# Patient Record
Sex: Female | Born: 1935
Health system: Southern US, Community
[De-identification: ages and names within clinical notes are randomized; demographics above are authoritative.]

## PROBLEM LIST (undated history)

## (undated) DIAGNOSIS — I7300 Raynaud's syndrome without gangrene: Secondary | ICD-10-CM

## (undated) DIAGNOSIS — E079 Disorder of thyroid, unspecified: Secondary | ICD-10-CM

## (undated) DIAGNOSIS — F0390 Unspecified dementia without behavioral disturbance: Secondary | ICD-10-CM

## (undated) DIAGNOSIS — E785 Hyperlipidemia, unspecified: Secondary | ICD-10-CM

## (undated) DIAGNOSIS — I10 Essential (primary) hypertension: Secondary | ICD-10-CM

## (undated) HISTORY — DX: Disorder of thyroid, unspecified: E07.9

## (undated) HISTORY — PX: COLONOSCOPY: SHX174

## (undated) HISTORY — DX: Hyperlipidemia, unspecified: E78.5

## (undated) HISTORY — DX: Essential (primary) hypertension: I10

## (undated) HISTORY — PX: APPENDECTOMY: SHX54

---

## 1997-07-12 ENCOUNTER — Other Ambulatory Visit: Admission: RE | Admit: 1997-07-12 | Discharge: 1997-07-12 | Payer: Self-pay | Admitting: Gynecology

## 1998-10-29 ENCOUNTER — Other Ambulatory Visit: Admission: RE | Admit: 1998-10-29 | Discharge: 1998-10-29 | Payer: Self-pay | Admitting: Gynecology

## 1999-03-30 ENCOUNTER — Encounter: Admission: RE | Admit: 1999-03-30 | Discharge: 1999-03-30 | Payer: Self-pay | Admitting: *Deleted

## 1999-03-30 ENCOUNTER — Encounter: Payer: Self-pay | Admitting: *Deleted

## 1999-08-21 ENCOUNTER — Encounter: Payer: Self-pay | Admitting: Gynecology

## 1999-08-21 ENCOUNTER — Encounter: Admission: RE | Admit: 1999-08-21 | Discharge: 1999-08-21 | Payer: Self-pay | Admitting: Gynecology

## 1999-12-23 ENCOUNTER — Other Ambulatory Visit: Admission: RE | Admit: 1999-12-23 | Discharge: 1999-12-23 | Payer: Self-pay | Admitting: Gynecology

## 2000-11-03 ENCOUNTER — Encounter: Payer: Self-pay | Admitting: Gynecology

## 2000-11-03 ENCOUNTER — Encounter: Admission: RE | Admit: 2000-11-03 | Discharge: 2000-11-03 | Payer: Self-pay | Admitting: Internal Medicine

## 2002-01-03 ENCOUNTER — Encounter: Payer: Self-pay | Admitting: *Deleted

## 2002-01-03 ENCOUNTER — Encounter: Admission: RE | Admit: 2002-01-03 | Discharge: 2002-01-03 | Payer: Self-pay | Admitting: *Deleted

## 2002-04-20 ENCOUNTER — Encounter: Admission: RE | Admit: 2002-04-20 | Discharge: 2002-04-20 | Payer: Self-pay | Admitting: Sports Medicine

## 2002-05-18 ENCOUNTER — Encounter: Admission: RE | Admit: 2002-05-18 | Discharge: 2002-05-18 | Payer: Self-pay | Admitting: Sports Medicine

## 2003-02-12 ENCOUNTER — Encounter: Admission: RE | Admit: 2003-02-12 | Discharge: 2003-02-12 | Payer: Self-pay | Admitting: *Deleted

## 2004-03-11 ENCOUNTER — Ambulatory Visit (HOSPITAL_COMMUNITY): Admission: RE | Admit: 2004-03-11 | Discharge: 2004-03-11 | Payer: Self-pay | Admitting: *Deleted

## 2004-05-26 ENCOUNTER — Ambulatory Visit: Payer: Self-pay | Admitting: Sports Medicine

## 2005-03-18 ENCOUNTER — Other Ambulatory Visit: Admission: RE | Admit: 2005-03-18 | Discharge: 2005-03-18 | Payer: Self-pay | Admitting: *Deleted

## 2005-05-11 ENCOUNTER — Ambulatory Visit (HOSPITAL_COMMUNITY): Admission: RE | Admit: 2005-05-11 | Discharge: 2005-05-11 | Payer: Self-pay | Admitting: *Deleted

## 2006-06-09 ENCOUNTER — Ambulatory Visit (HOSPITAL_COMMUNITY): Admission: RE | Admit: 2006-06-09 | Discharge: 2006-06-09 | Payer: Self-pay | Admitting: *Deleted

## 2006-07-20 ENCOUNTER — Telehealth: Payer: Self-pay | Admitting: *Deleted

## 2006-07-21 ENCOUNTER — Ambulatory Visit: Payer: Self-pay | Admitting: Sports Medicine

## 2006-07-21 DIAGNOSIS — M461 Sacroiliitis, not elsewhere classified: Secondary | ICD-10-CM | POA: Insufficient documentation

## 2006-07-26 ENCOUNTER — Encounter: Payer: Self-pay | Admitting: Family Medicine

## 2006-07-28 ENCOUNTER — Telehealth (INDEPENDENT_AMBULATORY_CARE_PROVIDER_SITE_OTHER): Payer: Self-pay | Admitting: Family Medicine

## 2006-08-17 ENCOUNTER — Telehealth: Payer: Self-pay | Admitting: *Deleted

## 2007-08-16 ENCOUNTER — Ambulatory Visit (HOSPITAL_COMMUNITY): Admission: RE | Admit: 2007-08-16 | Discharge: 2007-08-16 | Payer: Self-pay | Admitting: Family Medicine

## 2008-07-09 ENCOUNTER — Encounter (INDEPENDENT_AMBULATORY_CARE_PROVIDER_SITE_OTHER): Payer: Self-pay | Admitting: *Deleted

## 2008-11-19 ENCOUNTER — Other Ambulatory Visit: Admission: RE | Admit: 2008-11-19 | Discharge: 2008-11-19 | Payer: Self-pay | Admitting: Family Medicine

## 2009-05-16 ENCOUNTER — Telehealth: Payer: Self-pay | Admitting: Gastroenterology

## 2009-09-24 ENCOUNTER — Ambulatory Visit (HOSPITAL_COMMUNITY): Admission: RE | Admit: 2009-09-24 | Discharge: 2009-09-24 | Payer: Self-pay | Admitting: Obstetrics and Gynecology

## 2010-07-06 ENCOUNTER — Ambulatory Visit (AMBULATORY_SURGERY_CENTER): Payer: Medicare Other | Admitting: *Deleted

## 2010-07-06 VITALS — Ht 63.0 in | Wt 135.2 lb

## 2010-07-06 DIAGNOSIS — Z1211 Encounter for screening for malignant neoplasm of colon: Secondary | ICD-10-CM

## 2010-07-06 MED ORDER — PEG-KCL-NACL-NASULF-NA ASC-C 100 G PO SOLR: ORAL | Status: DC

## 2010-07-20 ENCOUNTER — Encounter: Payer: Self-pay | Admitting: Gastroenterology

## 2010-07-20 ENCOUNTER — Ambulatory Visit (AMBULATORY_SURGERY_CENTER): Payer: Medicare Other | Admitting: Gastroenterology

## 2010-07-20 VITALS — BP 141/73 | HR 56 | Temp 98.0°F | Resp 16 | Ht 63.0 in | Wt 130.0 lb

## 2010-07-20 DIAGNOSIS — K648 Other hemorrhoids: Secondary | ICD-10-CM

## 2010-07-20 DIAGNOSIS — Z1211 Encounter for screening for malignant neoplasm of colon: Secondary | ICD-10-CM

## 2010-07-20 MED ORDER — SODIUM CHLORIDE 0.9 % IV SOLN: 500.0000 mL | INTRAVENOUS | Status: DC

## 2010-07-20 NOTE — Patient Instructions (Signed)
INTERNAL HEMORRHOIDS HANDOUT GIVEN  REPEAT COLONOSCOPY IN 10 YEARS--WE WILL MAIL YOU A LETTER TO REMIND YOU OF THIS

## 2010-07-21 ENCOUNTER — Telehealth: Payer: Self-pay | Admitting: *Deleted

## 2010-07-21 NOTE — Telephone Encounter (Signed)

## 2010-08-24 NOTE — Telephone Encounter (Signed)
Summary: Schedule Colonoscopy   Phone Note Outgoing Call Call back at Home Phone [phone]   Call placed by: Harlow Mares CMA Duncan Dull),  May 16, 2009 9:44 AM Call placed to: Patient Summary of Call: patients husband will advise her she is due and have her call back. she is due for her colonoscopy Initial call taken by: Harlow Mares CMA Duncan Dull),  May 16, 2009 9:45 AM

## 2011-03-11 DIAGNOSIS — T63461A Toxic effect of venom of wasps, accidental (unintentional), initial encounter: Secondary | ICD-10-CM | POA: Diagnosis not present

## 2011-03-11 DIAGNOSIS — T6391XA Toxic effect of contact with unspecified venomous animal, accidental (unintentional), initial encounter: Secondary | ICD-10-CM | POA: Diagnosis not present

## 2011-04-07 DIAGNOSIS — T6391XA Toxic effect of contact with unspecified venomous animal, accidental (unintentional), initial encounter: Secondary | ICD-10-CM | POA: Diagnosis not present

## 2011-04-07 DIAGNOSIS — T63461A Toxic effect of venom of wasps, accidental (unintentional), initial encounter: Secondary | ICD-10-CM | POA: Diagnosis not present

## 2011-05-12 DIAGNOSIS — T63461A Toxic effect of venom of wasps, accidental (unintentional), initial encounter: Secondary | ICD-10-CM | POA: Diagnosis not present

## 2011-05-12 DIAGNOSIS — T6391XA Toxic effect of contact with unspecified venomous animal, accidental (unintentional), initial encounter: Secondary | ICD-10-CM | POA: Diagnosis not present

## 2011-06-10 DIAGNOSIS — T63461A Toxic effect of venom of wasps, accidental (unintentional), initial encounter: Secondary | ICD-10-CM | POA: Diagnosis not present

## 2011-06-10 DIAGNOSIS — T6391XA Toxic effect of contact with unspecified venomous animal, accidental (unintentional), initial encounter: Secondary | ICD-10-CM | POA: Diagnosis not present

## 2011-06-29 DIAGNOSIS — J301 Allergic rhinitis due to pollen: Secondary | ICD-10-CM | POA: Diagnosis not present

## 2011-06-29 DIAGNOSIS — J3081 Allergic rhinitis due to animal (cat) (dog) hair and dander: Secondary | ICD-10-CM | POA: Diagnosis not present

## 2011-06-29 DIAGNOSIS — T6391XA Toxic effect of contact with unspecified venomous animal, accidental (unintentional), initial encounter: Secondary | ICD-10-CM | POA: Diagnosis not present

## 2011-06-29 DIAGNOSIS — J3089 Other allergic rhinitis: Secondary | ICD-10-CM | POA: Diagnosis not present

## 2011-07-08 DIAGNOSIS — T6391XA Toxic effect of contact with unspecified venomous animal, accidental (unintentional), initial encounter: Secondary | ICD-10-CM | POA: Diagnosis not present

## 2011-07-08 DIAGNOSIS — T783XXA Angioneurotic edema, initial encounter: Secondary | ICD-10-CM | POA: Diagnosis not present

## 2011-07-08 DIAGNOSIS — E039 Hypothyroidism, unspecified: Secondary | ICD-10-CM | POA: Diagnosis not present

## 2011-07-21 DIAGNOSIS — L821 Other seborrheic keratosis: Secondary | ICD-10-CM | POA: Diagnosis not present

## 2011-07-21 DIAGNOSIS — D239 Other benign neoplasm of skin, unspecified: Secondary | ICD-10-CM | POA: Diagnosis not present

## 2011-07-22 DIAGNOSIS — T63461A Toxic effect of venom of wasps, accidental (unintentional), initial encounter: Secondary | ICD-10-CM | POA: Diagnosis not present

## 2011-07-22 DIAGNOSIS — T6391XA Toxic effect of contact with unspecified venomous animal, accidental (unintentional), initial encounter: Secondary | ICD-10-CM | POA: Diagnosis not present

## 2011-08-23 DIAGNOSIS — T6391XA Toxic effect of contact with unspecified venomous animal, accidental (unintentional), initial encounter: Secondary | ICD-10-CM | POA: Diagnosis not present

## 2011-08-23 DIAGNOSIS — T63461A Toxic effect of venom of wasps, accidental (unintentional), initial encounter: Secondary | ICD-10-CM | POA: Diagnosis not present

## 2011-09-28 DIAGNOSIS — J301 Allergic rhinitis due to pollen: Secondary | ICD-10-CM | POA: Diagnosis not present

## 2011-09-28 DIAGNOSIS — J3089 Other allergic rhinitis: Secondary | ICD-10-CM | POA: Diagnosis not present

## 2011-09-28 DIAGNOSIS — T6391XA Toxic effect of contact with unspecified venomous animal, accidental (unintentional), initial encounter: Secondary | ICD-10-CM | POA: Diagnosis not present

## 2011-09-28 DIAGNOSIS — T63461A Toxic effect of venom of wasps, accidental (unintentional), initial encounter: Secondary | ICD-10-CM | POA: Diagnosis not present

## 2011-09-28 DIAGNOSIS — J3081 Allergic rhinitis due to animal (cat) (dog) hair and dander: Secondary | ICD-10-CM | POA: Diagnosis not present

## 2011-10-06 DIAGNOSIS — T783XXA Angioneurotic edema, initial encounter: Secondary | ICD-10-CM | POA: Diagnosis not present

## 2011-10-18 DIAGNOSIS — I10 Essential (primary) hypertension: Secondary | ICD-10-CM | POA: Diagnosis not present

## 2011-10-18 DIAGNOSIS — E7800 Pure hypercholesterolemia, unspecified: Secondary | ICD-10-CM | POA: Diagnosis not present

## 2011-10-18 DIAGNOSIS — E039 Hypothyroidism, unspecified: Secondary | ICD-10-CM | POA: Diagnosis not present

## 2011-10-22 DIAGNOSIS — I10 Essential (primary) hypertension: Secondary | ICD-10-CM | POA: Diagnosis not present

## 2011-10-22 DIAGNOSIS — M6740 Ganglion, unspecified site: Secondary | ICD-10-CM | POA: Diagnosis not present

## 2011-10-22 DIAGNOSIS — E039 Hypothyroidism, unspecified: Secondary | ICD-10-CM | POA: Diagnosis not present

## 2011-10-22 DIAGNOSIS — E7800 Pure hypercholesterolemia, unspecified: Secondary | ICD-10-CM | POA: Diagnosis not present

## 2011-11-16 DIAGNOSIS — T6391XA Toxic effect of contact with unspecified venomous animal, accidental (unintentional), initial encounter: Secondary | ICD-10-CM | POA: Diagnosis not present

## 2011-11-16 DIAGNOSIS — M19049 Primary osteoarthritis, unspecified hand: Secondary | ICD-10-CM | POA: Diagnosis not present

## 2011-11-16 DIAGNOSIS — T63461A Toxic effect of venom of wasps, accidental (unintentional), initial encounter: Secondary | ICD-10-CM | POA: Diagnosis not present

## 2011-12-02 DIAGNOSIS — H2510 Age-related nuclear cataract, unspecified eye: Secondary | ICD-10-CM | POA: Diagnosis not present

## 2011-12-02 DIAGNOSIS — H5000 Unspecified esotropia: Secondary | ICD-10-CM | POA: Diagnosis not present

## 2011-12-06 DIAGNOSIS — Z23 Encounter for immunization: Secondary | ICD-10-CM | POA: Diagnosis not present

## 2011-12-28 DIAGNOSIS — J3081 Allergic rhinitis due to animal (cat) (dog) hair and dander: Secondary | ICD-10-CM | POA: Diagnosis not present

## 2011-12-28 DIAGNOSIS — T63461A Toxic effect of venom of wasps, accidental (unintentional), initial encounter: Secondary | ICD-10-CM | POA: Diagnosis not present

## 2011-12-28 DIAGNOSIS — T6391XA Toxic effect of contact with unspecified venomous animal, accidental (unintentional), initial encounter: Secondary | ICD-10-CM | POA: Diagnosis not present

## 2011-12-28 DIAGNOSIS — J301 Allergic rhinitis due to pollen: Secondary | ICD-10-CM | POA: Diagnosis not present

## 2011-12-28 DIAGNOSIS — J3089 Other allergic rhinitis: Secondary | ICD-10-CM | POA: Diagnosis not present

## 2012-02-14 DIAGNOSIS — T6391XA Toxic effect of contact with unspecified venomous animal, accidental (unintentional), initial encounter: Secondary | ICD-10-CM | POA: Diagnosis not present

## 2012-02-14 DIAGNOSIS — T63461A Toxic effect of venom of wasps, accidental (unintentional), initial encounter: Secondary | ICD-10-CM | POA: Diagnosis not present

## 2012-03-17 ENCOUNTER — Ambulatory Visit (INDEPENDENT_AMBULATORY_CARE_PROVIDER_SITE_OTHER): Payer: Medicare Other | Admitting: Family Medicine

## 2012-03-17 VITALS — BP 150/90

## 2012-03-17 DIAGNOSIS — M549 Dorsalgia, unspecified: Secondary | ICD-10-CM

## 2012-03-17 NOTE — Patient Instructions (Addendum)
You have some musculoskeletal pain in your low back; part of this can be improved with a good core strengthening program for the kow back. I gave you those exsecises and would recommend doing them daily. I think ibuprofen is Ok as long as you are using the prescribed dose and having no side effects.  If this does not improve your symptoms in 4-6 weeks, please su [place] back in clinic. Great to meet you!

## 2012-03-17 NOTE — Progress Notes (Signed)
  Subjective:    Patient ID: Michelle Reeves, female    DOB: 1934/06/30, 78 y.o.   MRN: [id number]  HPI #1. Low back pain. Reports that is 2-3/10 most of the time. She thinks it started when she and her husband began to travel around the country in their car. He frequently traveled to [place] and to [place] to see family. After sitting in the car for a hours or so she has a lot of stiffness. She has no radiation to the legs specifically no pain or weakness in her legs. Pain is located in the mid lower back a little bit more on the left than the right. She denies any bowel or bladder incontinence. It is using resolved with using a pillow in the car seat and some ibuprofen. She is active using the treadmill and or the elliptical at home for as much as an hour a day. Has been using ibuprofen one over-the-counter tablet every 4-6 hours intermittently for pain relief and that seems to work well.   Review of Systems Denies numbness or weakness in her legs or feet. Pain does not radiate into the buttock or leg area. See history of present illness above for additional partner review of systems.    Objective:   Physical Exam  Vital signs are reviewed. Initially she had elevated blood pressure and this was rechecked and it was still elevated. I urged her to followup with her PCP regarding this. GENERAL: Well-developed female no acute distress BACK: No defect noted. No curvature. Nontender to percussion of the vertebra in the lumbar area. SI joints are nontender to palpation. Faber bilaterally is negative. Extremity: Straight leg raising seated and supine positions is negative. Lower extremity strength is 5 out of 5. Neuro: DTRs are 2+ at the ankle and knee bilaterally.. sensation bilateral lower extremities to soft touch is normal Strength is 5 out of 5 at the hip flexors and extensors lower leg extension and flexion, dorsiflexion and plantar flexion. HIPS: She has full range of motion both hips. She can  hyperextend about 10 in flex at the hips to about 60. Her flexion at the hips is limited somewhat by her extreme tightness of her hamstrings.      Assessment & Plan:  Muscle skeletal or mechanical low back pain, mild. I would put her on the home exercise program for back strengthening and have her followup if this does not resolve. There is no sign or symptom here to make me concerned for spinal cord or disc pathology.

## 2012-03-24 DIAGNOSIS — M272 Inflammatory conditions of jaws: Secondary | ICD-10-CM | POA: Diagnosis not present

## 2012-03-28 DIAGNOSIS — J3081 Allergic rhinitis due to animal (cat) (dog) hair and dander: Secondary | ICD-10-CM | POA: Diagnosis not present

## 2012-03-28 DIAGNOSIS — T6391XA Toxic effect of contact with unspecified venomous animal, accidental (unintentional), initial encounter: Secondary | ICD-10-CM | POA: Diagnosis not present

## 2012-03-28 DIAGNOSIS — T63461A Toxic effect of venom of wasps, accidental (unintentional), initial encounter: Secondary | ICD-10-CM | POA: Diagnosis not present

## 2012-03-28 DIAGNOSIS — J301 Allergic rhinitis due to pollen: Secondary | ICD-10-CM | POA: Diagnosis not present

## 2012-03-28 DIAGNOSIS — J3089 Other allergic rhinitis: Secondary | ICD-10-CM | POA: Diagnosis not present

## 2012-04-15 ENCOUNTER — Other Ambulatory Visit: Payer: Self-pay

## 2012-04-26 DIAGNOSIS — T6391XA Toxic effect of contact with unspecified venomous animal, accidental (unintentional), initial encounter: Secondary | ICD-10-CM | POA: Diagnosis not present

## 2012-04-26 DIAGNOSIS — T63461A Toxic effect of venom of wasps, accidental (unintentional), initial encounter: Secondary | ICD-10-CM | POA: Diagnosis not present

## 2012-05-01 DIAGNOSIS — E7800 Pure hypercholesterolemia, unspecified: Secondary | ICD-10-CM | POA: Diagnosis not present

## 2012-05-01 DIAGNOSIS — E039 Hypothyroidism, unspecified: Secondary | ICD-10-CM | POA: Diagnosis not present

## 2012-05-01 DIAGNOSIS — M6740 Ganglion, unspecified site: Secondary | ICD-10-CM | POA: Diagnosis not present

## 2012-05-01 DIAGNOSIS — I10 Essential (primary) hypertension: Secondary | ICD-10-CM | POA: Diagnosis not present

## 2012-05-22 DIAGNOSIS — J309 Allergic rhinitis, unspecified: Secondary | ICD-10-CM | POA: Diagnosis not present

## 2012-06-30 DIAGNOSIS — J309 Allergic rhinitis, unspecified: Secondary | ICD-10-CM | POA: Diagnosis not present

## 2012-07-28 DIAGNOSIS — T63461A Toxic effect of venom of wasps, accidental (unintentional), initial encounter: Secondary | ICD-10-CM | POA: Diagnosis not present

## 2012-07-28 DIAGNOSIS — T6391XA Toxic effect of contact with unspecified venomous animal, accidental (unintentional), initial encounter: Secondary | ICD-10-CM | POA: Diagnosis not present

## 2012-09-04 DIAGNOSIS — T6391XA Toxic effect of contact with unspecified venomous animal, accidental (unintentional), initial encounter: Secondary | ICD-10-CM | POA: Diagnosis not present

## 2012-09-04 DIAGNOSIS — T63461A Toxic effect of venom of wasps, accidental (unintentional), initial encounter: Secondary | ICD-10-CM | POA: Diagnosis not present

## 2012-10-04 ENCOUNTER — Other Ambulatory Visit: Payer: Self-pay

## 2012-10-19 DIAGNOSIS — E7800 Pure hypercholesterolemia, unspecified: Secondary | ICD-10-CM | POA: Diagnosis not present

## 2012-10-19 DIAGNOSIS — Z79899 Other long term (current) drug therapy: Secondary | ICD-10-CM | POA: Diagnosis not present

## 2012-10-19 DIAGNOSIS — E039 Hypothyroidism, unspecified: Secondary | ICD-10-CM | POA: Diagnosis not present

## 2012-11-01 DIAGNOSIS — E7800 Pure hypercholesterolemia, unspecified: Secondary | ICD-10-CM | POA: Diagnosis not present

## 2012-11-01 DIAGNOSIS — E039 Hypothyroidism, unspecified: Secondary | ICD-10-CM | POA: Diagnosis not present

## 2012-11-01 DIAGNOSIS — Z23 Encounter for immunization: Secondary | ICD-10-CM | POA: Diagnosis not present

## 2012-11-01 DIAGNOSIS — I10 Essential (primary) hypertension: Secondary | ICD-10-CM | POA: Diagnosis not present

## 2012-12-06 DIAGNOSIS — T6391XA Toxic effect of contact with unspecified venomous animal, accidental (unintentional), initial encounter: Secondary | ICD-10-CM | POA: Diagnosis not present

## 2012-12-06 DIAGNOSIS — T63461A Toxic effect of venom of wasps, accidental (unintentional), initial encounter: Secondary | ICD-10-CM | POA: Diagnosis not present

## 2012-12-13 DIAGNOSIS — T6391XA Toxic effect of contact with unspecified venomous animal, accidental (unintentional), initial encounter: Secondary | ICD-10-CM | POA: Diagnosis not present

## 2012-12-13 DIAGNOSIS — T63461A Toxic effect of venom of wasps, accidental (unintentional), initial encounter: Secondary | ICD-10-CM | POA: Diagnosis not present

## 2012-12-20 DIAGNOSIS — T63461A Toxic effect of venom of wasps, accidental (unintentional), initial encounter: Secondary | ICD-10-CM | POA: Diagnosis not present

## 2012-12-20 DIAGNOSIS — T6391XA Toxic effect of contact with unspecified venomous animal, accidental (unintentional), initial encounter: Secondary | ICD-10-CM | POA: Diagnosis not present

## 2012-12-24 DIAGNOSIS — S71009A Unspecified open wound, unspecified hip, initial encounter: Secondary | ICD-10-CM | POA: Diagnosis not present

## 2012-12-26 DIAGNOSIS — S81009A Unspecified open wound, unspecified knee, initial encounter: Secondary | ICD-10-CM | POA: Diagnosis not present

## 2012-12-29 DIAGNOSIS — T6391XA Toxic effect of contact with unspecified venomous animal, accidental (unintentional), initial encounter: Secondary | ICD-10-CM | POA: Diagnosis not present

## 2012-12-29 DIAGNOSIS — J309 Allergic rhinitis, unspecified: Secondary | ICD-10-CM | POA: Diagnosis not present

## 2013-01-04 ENCOUNTER — Other Ambulatory Visit: Payer: Self-pay

## 2013-01-31 DIAGNOSIS — T63461A Toxic effect of venom of wasps, accidental (unintentional), initial encounter: Secondary | ICD-10-CM | POA: Diagnosis not present

## 2013-01-31 DIAGNOSIS — T6391XA Toxic effect of contact with unspecified venomous animal, accidental (unintentional), initial encounter: Secondary | ICD-10-CM | POA: Diagnosis not present

## 2013-02-01 DIAGNOSIS — E7800 Pure hypercholesterolemia, unspecified: Secondary | ICD-10-CM | POA: Diagnosis not present

## 2013-02-01 DIAGNOSIS — I10 Essential (primary) hypertension: Secondary | ICD-10-CM | POA: Diagnosis not present

## 2013-02-01 DIAGNOSIS — E039 Hypothyroidism, unspecified: Secondary | ICD-10-CM | POA: Diagnosis not present

## 2013-02-01 DIAGNOSIS — Z23 Encounter for immunization: Secondary | ICD-10-CM | POA: Diagnosis not present

## 2013-02-20 DIAGNOSIS — D210 Benign neoplasm of connective and other soft tissue of head, face and neck: Secondary | ICD-10-CM | POA: Diagnosis not present

## 2013-02-20 DIAGNOSIS — D485 Neoplasm of uncertain behavior of skin: Secondary | ICD-10-CM | POA: Diagnosis not present

## 2013-02-20 DIAGNOSIS — M79609 Pain in unspecified limb: Secondary | ICD-10-CM | POA: Diagnosis not present

## 2013-02-20 DIAGNOSIS — M659 Synovitis and tenosynovitis, unspecified: Secondary | ICD-10-CM | POA: Diagnosis not present

## 2013-02-20 DIAGNOSIS — D2370 Other benign neoplasm of skin of unspecified lower limb, including hip: Secondary | ICD-10-CM | POA: Diagnosis not present

## 2013-03-05 DIAGNOSIS — T63461A Toxic effect of venom of wasps, accidental (unintentional), initial encounter: Secondary | ICD-10-CM | POA: Diagnosis not present

## 2013-03-05 DIAGNOSIS — T6391XA Toxic effect of contact with unspecified venomous animal, accidental (unintentional), initial encounter: Secondary | ICD-10-CM | POA: Diagnosis not present

## 2013-03-08 ENCOUNTER — Ambulatory Visit: Payer: Self-pay | Admitting: Podiatry

## 2013-04-03 DIAGNOSIS — T63461A Toxic effect of venom of wasps, accidental (unintentional), initial encounter: Secondary | ICD-10-CM | POA: Diagnosis not present

## 2013-04-03 DIAGNOSIS — J3081 Allergic rhinitis due to animal (cat) (dog) hair and dander: Secondary | ICD-10-CM | POA: Diagnosis not present

## 2013-04-03 DIAGNOSIS — T6391XA Toxic effect of contact with unspecified venomous animal, accidental (unintentional), initial encounter: Secondary | ICD-10-CM | POA: Diagnosis not present

## 2013-04-03 DIAGNOSIS — J3089 Other allergic rhinitis: Secondary | ICD-10-CM | POA: Diagnosis not present

## 2013-04-03 DIAGNOSIS — J301 Allergic rhinitis due to pollen: Secondary | ICD-10-CM | POA: Diagnosis not present

## 2013-05-08 DIAGNOSIS — T63461A Toxic effect of venom of wasps, accidental (unintentional), initial encounter: Secondary | ICD-10-CM | POA: Diagnosis not present

## 2013-05-08 DIAGNOSIS — T6391XA Toxic effect of contact with unspecified venomous animal, accidental (unintentional), initial encounter: Secondary | ICD-10-CM | POA: Diagnosis not present

## 2013-06-12 DIAGNOSIS — T6391XA Toxic effect of contact with unspecified venomous animal, accidental (unintentional), initial encounter: Secondary | ICD-10-CM | POA: Diagnosis not present

## 2013-06-12 DIAGNOSIS — T63461A Toxic effect of venom of wasps, accidental (unintentional), initial encounter: Secondary | ICD-10-CM | POA: Diagnosis not present

## 2013-07-02 DIAGNOSIS — T6391XA Toxic effect of contact with unspecified venomous animal, accidental (unintentional), initial encounter: Secondary | ICD-10-CM | POA: Diagnosis not present

## 2013-07-02 DIAGNOSIS — T63461A Toxic effect of venom of wasps, accidental (unintentional), initial encounter: Secondary | ICD-10-CM | POA: Diagnosis not present

## 2013-08-02 DIAGNOSIS — T63461A Toxic effect of venom of wasps, accidental (unintentional), initial encounter: Secondary | ICD-10-CM | POA: Diagnosis not present

## 2013-08-02 DIAGNOSIS — T6391XA Toxic effect of contact with unspecified venomous animal, accidental (unintentional), initial encounter: Secondary | ICD-10-CM | POA: Diagnosis not present

## 2013-08-28 DIAGNOSIS — D2370 Other benign neoplasm of skin of unspecified lower limb, including hip: Secondary | ICD-10-CM | POA: Diagnosis not present

## 2013-08-28 DIAGNOSIS — M659 Synovitis and tenosynovitis, unspecified: Secondary | ICD-10-CM | POA: Diagnosis not present

## 2013-08-30 DIAGNOSIS — T63461A Toxic effect of venom of wasps, accidental (unintentional), initial encounter: Secondary | ICD-10-CM | POA: Diagnosis not present

## 2013-08-30 DIAGNOSIS — T6391XA Toxic effect of contact with unspecified venomous animal, accidental (unintentional), initial encounter: Secondary | ICD-10-CM | POA: Diagnosis not present

## 2013-10-05 DIAGNOSIS — T63461A Toxic effect of venom of wasps, accidental (unintentional), initial encounter: Secondary | ICD-10-CM | POA: Diagnosis not present

## 2013-10-05 DIAGNOSIS — T6391XA Toxic effect of contact with unspecified venomous animal, accidental (unintentional), initial encounter: Secondary | ICD-10-CM | POA: Diagnosis not present

## 2013-10-25 DIAGNOSIS — I10 Essential (primary) hypertension: Secondary | ICD-10-CM | POA: Diagnosis not present

## 2013-10-25 DIAGNOSIS — E039 Hypothyroidism, unspecified: Secondary | ICD-10-CM | POA: Diagnosis not present

## 2013-10-25 DIAGNOSIS — E7800 Pure hypercholesterolemia, unspecified: Secondary | ICD-10-CM | POA: Diagnosis not present

## 2013-11-14 ENCOUNTER — Encounter: Payer: Self-pay | Admitting: Sports Medicine

## 2013-11-14 ENCOUNTER — Ambulatory Visit (INDEPENDENT_AMBULATORY_CARE_PROVIDER_SITE_OTHER): Payer: Medicare Other | Admitting: Sports Medicine

## 2013-11-14 VITALS — BP 155/64 | HR 75 | Ht 63.0 in | Wt 134.0 lb

## 2013-11-14 DIAGNOSIS — M75102 Unspecified rotator cuff tear or rupture of left shoulder, not specified as traumatic: Secondary | ICD-10-CM

## 2013-11-14 DIAGNOSIS — M2040 Other hammer toe(s) (acquired), unspecified foot: Secondary | ICD-10-CM | POA: Insufficient documentation

## 2013-11-14 DIAGNOSIS — M75100 Unspecified rotator cuff tear or rupture of unspecified shoulder, not specified as traumatic: Secondary | ICD-10-CM | POA: Insufficient documentation

## 2013-11-14 DIAGNOSIS — M25569 Pain in unspecified knee: Secondary | ICD-10-CM

## 2013-11-14 DIAGNOSIS — M25562 Pain in left knee: Secondary | ICD-10-CM | POA: Insufficient documentation

## 2013-11-14 DIAGNOSIS — S43429A Sprain of unspecified rotator cuff capsule, initial encounter: Secondary | ICD-10-CM | POA: Diagnosis present

## 2013-11-14 DIAGNOSIS — M2042 Other hammer toe(s) (acquired), left foot: Secondary | ICD-10-CM

## 2013-11-14 MED ORDER — NITROGLYCERIN 0.2 MG/HR TD PT24: MEDICATED_PATCH | TRANSDERMAL | Status: DC

## 2013-11-14 NOTE — Assessment & Plan Note (Signed)
Left foot hammertoe deformity and bunion, s/p surgery with persistent symptoms.  -1st ray post added to insole  -Hammer toe pad given  -Fitted toe separation pad  -She will likely eventually need another pair of updated custom orthotics  Follow-up in 4-6 weeks or sooner if needed.

## 2013-11-14 NOTE — Patient Instructions (Addendum)
-  Compression sleeve given to wear on the left knee unless sleeping -Start straight leg raises and lateral leg raises -Start shoulder exercises   Nitroglycerin Protocol   Apply 1/4 nitroglycerin patch to affected area daily.  Change position of patch within the affected area every 24 hours.  You may experience a headache during the first 1-2 weeks of using the patch, these should subside.  If you experience headaches after beginning nitroglycerin patch treatment, you may take your preferred over the counter pain reliever.  Another side effect of the nitroglycerin patch is skin irritation or rash related to patch adhesive.  Please notify our office if you develop more severe headaches or rash, and stop the patch.  Tendon healing with nitroglycerin patch may require 12 to 24 weeks depending on the extent of injury.  Men should not use if taking Viagra, Cialis, or Levitra.   Do not use if you have migraines or rosacea.

## 2013-11-14 NOTE — Assessment & Plan Note (Signed)
Left knee pain with effusion superolaterally.  -We provided her with a body helix compression sleeve for the left knee.  -She was given straight leg and lateral leg raises exercises  -She may continue tramadol as needed, which she has at home.

## 2013-11-14 NOTE — Progress Notes (Signed)
Subjective:    Patient ID: Jamesetta Geralds, female    DOB: [birth year], 80 y.o.   MRN: [id number]  HPI Mrs. Frisinger is a 79 year old female who presents with multiple concerns today.  She complains of left shoulder and left knee pain since she fell off a ladder 5 months ago in her home.  She notes that she fell on the left side of her shoulder and left knee.  Further shoulder goes, movements such as lifting or reaching behind her and aggravate her symptoms.  Character of pain as an aching.  Location of pain is primarily in the lateral left shoulder.  She denies any radiation.  She rates the severity 4/10.  She has tried ibuprofen 200 mg once daily some relief.  As far as the left knee, she describes that she struck her knee on the anterolateral surface when she fell off the ladder.  Location of pain is now anterolateral.  She denies any associated swelling, locking, or catching.  Symptoms are a regular rhythm walking and standing.  She has not tried anything for her knee pain.  As a separate issue she is also complaining of left foot pain.  She has had surgery for her left foot. She presents with a persistent bunion deformity with a hammertoe of the second toe.  She has tried custom orthotics with no relief of symptoms.  She is inquiring as to Dr. Nona Dell opinion regarding her foot. She states that her pain in the foot is mild to moderate in severity. Rest relieves her symptoms.  Activity aggravates him.  Past medical, social, medications, and allergies are reviewed and are up-to-date in chart. Review of Systems 11 point review of systems was performed is otherwise negative him started in the above history of present illness.    Objective:   Physical Exam BP 155/64  Pulse 75  Ht 5\' 3"  (1.6 m)  Wt 134 lb (60.782 kg)  BMI 23.74 kg/m2 GEN: The patient is well-developed well-nourished female and in no acute distress.  She is awake alert and oriented x3. SKIN: warm and well-perfused, no rash  EXTR: No lower  extremity edema or calf tenderness Neuro: Strength 5/5 globally. Sensation intact throughout. DTRs 2/4 bilaterally. No focal deficits. Vasc: +2 bilateral distal pulses. No edema.  MSK:   Examination of the left shoulder reveals range of motion is the following: Flexion: 170 degrees Abduction: 170 degrees Int rot/ext: L1 Ad ER: 45 degrees Ab ER: 90 degrees Ab Int Rot: 45 degrees Special Tests: +Jobe's test, +Hawkin's. Neg Speed's. Good intrinsic rotator cuff strength except with supraspinatus testing. No TTP at [facility] joint.  Examination of the left knee reveals +1 effusion.  Range of motion is from 0-130. Tenderness to palpation of the lateral joint line.  Negative Lachman's.  Negative McMurray's.  Negative anterior drawer.  Negative patellar grind test.  No overlying erythema or induration.  No medial joint line tenderness.  Examination of the left foot reveals a hammertoe deformity of the second toe.  She has a bunion deformity as well.  She has tenderness along the medial longitudinal arch.  She has callus formation over the first and second toes.  Limited musculoskeletal ultrasound: Limited musculoskeletal ultrasound was performed of the left knee and left shoulder. In short axis is her obtained.  The left biceps appears normal.  The left subscapularis muscle and tendon appeared normal.  Shows a full thickness supraspinatus tear approximately 2-3 cm proximally to the insertion of the humeral head without any  retraction.  Her infraspinatus appears normal.  She has some echogenicity in the rotator cuff interval.  Examination of the left knee reveals a small superolateral knee effusion resuming from the vastus lateralis tendinous insertion of the patella.  She has some arthritic changes of the patella.  Her trochlear groove overall looks normal.     Assessment & Plan:  1. Left shoulder full-thickness supraspinatus tear without retraction. -She'll start on the Nitroglycerin protocol. -Report  provided her a Thera-Band and with shoulder rehabilitation exercises. -She'll follow up in 4-6 weeks or sooner if needed.  2. Left knee pain with effusion superolaterally. -We provided her with a body helix compression sleeve for the left knee. -She was given straight leg and lateral leg raises exercises -She may continue tramadol as needed, which she has at home.  3. Left foot hammertoe deformity and bunion, s/p surgery with persistent symptoms. -1st ray post added to insole -Hammer toe pad given -Fitted toe separation pad -She will likely eventually need another pair of updated custom orthotics  Follow-up in 4-6 weeks or sooner if needed.

## 2013-11-14 NOTE — Assessment & Plan Note (Signed)
Left shoulder full-thickness supraspinatus tear without retraction.  -She'll start on the Nitroglycerin protocol.  -Report provided her a Thera-Band and with shoulder rehabilitation exercises.  -She'll follow up in 4-6 weeks or sooner if needed.

## 2013-11-20 DIAGNOSIS — E039 Hypothyroidism, unspecified: Secondary | ICD-10-CM | POA: Diagnosis not present

## 2013-11-20 DIAGNOSIS — Z23 Encounter for immunization: Secondary | ICD-10-CM | POA: Diagnosis not present

## 2013-11-20 DIAGNOSIS — I10 Essential (primary) hypertension: Secondary | ICD-10-CM | POA: Diagnosis not present

## 2013-11-20 DIAGNOSIS — R413 Other amnesia: Secondary | ICD-10-CM | POA: Diagnosis not present

## 2013-11-20 DIAGNOSIS — E7800 Pure hypercholesterolemia, unspecified: Secondary | ICD-10-CM | POA: Diagnosis not present

## 2013-11-30 DIAGNOSIS — T63451D Toxic effect of venom of hornets, accidental (unintentional), subsequent encounter: Secondary | ICD-10-CM | POA: Diagnosis not present

## 2013-12-19 ENCOUNTER — Encounter: Payer: Self-pay | Admitting: Sports Medicine

## 2013-12-19 ENCOUNTER — Ambulatory Visit (INDEPENDENT_AMBULATORY_CARE_PROVIDER_SITE_OTHER): Payer: Medicare Other | Admitting: Sports Medicine

## 2013-12-19 ENCOUNTER — Ambulatory Visit
Admission: RE | Admit: 2013-12-19 | Discharge: 2013-12-19 | Disposition: A | Discharge status: Home / Self Care | Payer: 59 | Source: Ambulatory Visit | Attending: Sports Medicine | Admitting: Sports Medicine

## 2013-12-19 VITALS — BP 137/70 | HR 75 | Ht 63.0 in | Wt 134.0 lb

## 2013-12-19 DIAGNOSIS — M25562 Pain in left knee: Secondary | ICD-10-CM | POA: Diagnosis not present

## 2013-12-19 DIAGNOSIS — M75102 Unspecified rotator cuff tear or rupture of left shoulder, not specified as traumatic: Secondary | ICD-10-CM

## 2013-12-19 DIAGNOSIS — M19012 Primary osteoarthritis, left shoulder: Secondary | ICD-10-CM | POA: Diagnosis not present

## 2013-12-19 NOTE — Patient Instructions (Signed)
Shoulder Exercises: 1. Arm lifts to 90 degrees in front, at 45 degrees, and at 90 degrees, 3 sets of 8. 2. Lawn mower pull exercise, light wt, 5lbs. 3. Try bike or recumbent bike, instead of long walking. 4. Continue nitroglycerin patch daily 5. Follow-up the x-ray result 6. Follow-up in 6 weeks

## 2013-12-19 NOTE — Assessment & Plan Note (Addendum)
-  With persistent effusion, possibly resulting from superolateral calcific spur irritation. -She will continue to wear the knee sleeve. -We discussed modifying her activities, namely limiting the distance and frequency with which she is walking, and instead doing more recumbent bike or stationary bike exercises that his lower impact for her knees. -She'll followup in 6 weeks or sooner if needed.

## 2013-12-19 NOTE — Progress Notes (Signed)
Subjective:    Patient ID: Michelle Reeves, female    DOB: January 29, 1935, 79 y.o.   MRN: [id number]  HPI Michelle Reeves is a 79 year old right-hand-dominant female presents for followup of left shoulder and knee pain. Since she was last seen she has been using the nitroglycerin protocol on her left shoulder for a full-thickness supraspinatus tear.  She admits that she has not been doing her exercises as diligently.She complains of continued limited range of motion of the left shoulder with pain aggravated with overhead movements of the left arm.  She denies any significant pain that wakes her at night.  She complains of some associated left arm weakness with lifting things above her head.  She denies any neck pain, numbness, tingling, fever, or chills.  Her symptoms were started following a fall off a ladder.  She has not yet had x-rays of the left shoulder.  As far as her left knee, she complains of some mild continued pain in the superolateral portion of her left knee.  She says that it generally seems to be swollen, despite wearing a body Body Helix compression sleeve.She denies any catching, popping, or giving way. She did, however ago for a 5-1/2 mile walk yesterday.  Past medical history, social history, medications, and allergies were reviewed and are up to date in the chart.  Review of Systems 7 point review of systems was performed and was otherwise negative unless noted in the history of present illness.     Objective:   Physical Exam BP 137/70  Pulse 75  Ht 5\' 3"  (1.6 m)  Wt 134 lb (60.782 kg)  BMI 23.74 kg/m2 GEN: The patient is well-developed well-nourished female and in no acute distress.  She is awake alert and oriented x3. SKIN: warm and well-perfused, no rash  EXTR: No lower extremity edema or calf tenderness Neuro: Strength 5/5 globally, except noted in the rotator cuff. Sensation intact throughout. DTRs 2/4 bilaterally. No focal deficits. Vasc: +2 bilateral distal pulses. No edema.    MSK: Examination of the left kneeReveals fairly pain free range of motion without popping or crepitus.  There does appear to be a trace effusion.  She has superolateral peripatellar tenderness.  Negative Lachman's.  Negative McMurray's.  No valgus or varus laxity.  Examination of the left shoulder reveals flexion and abduction to 90.  She has a painful arc.  She has supraspinatus weakness with Jobes test.  Minimal tenderness of the a.c. Joint.   Negative abdominal compression test.  She has no atrophy over the left shoulder.  Limited musculoskeletal ultrasound: Long and short axis views were obtained of the patient's left shoulder and left knee.  The left knee appears to have persistent moderate sized effusion with fluid around an area superolaterally at the patella where there is a calcific spur noted.  The quadriceps and patellar tendons otherwise appeared normal, as well as the medial lateral menisci.  She does have significant loss of joint space however, and some patellofemoral degenerative change. Examination of the patient's left shoulder reveals a hypoechoic change of the midsubstance of the supraspinatus tendon near its insertion, consistent with at least a partial tear.  This is improved compared to the previous study.  There does not appear to be significant retraction. The biceps tendon appears normal.  She does have degenerative change seen on the humeral head and acromioclavicular joint.  Her infraspinatus and teres minor appeared normal.  Her subscapularis appears normal.     Assessment & Plan:  Please see problem based assessment and plan in the problem list.

## 2013-12-19 NOTE — Assessment & Plan Note (Signed)
-  Continue nitroglycerin protocol for 6 more weeks -Emphasized and redemonstrated rotator cuff strengthening exercises, Spokes on a wheel exercises. -We will also obtain an x-ray of the left shoulder.  I later reviewed the x-ray obtained today, which was consistent with chronic rotator cuff tearing and degenerative glenohumeral and acromioclavicular changes.  No acute fractures are seen. -She will followup in 6 weeks or sooner if needed.

## 2014-01-04 DIAGNOSIS — T63451D Toxic effect of venom of hornets, accidental (unintentional), subsequent encounter: Secondary | ICD-10-CM | POA: Diagnosis not present

## 2014-01-30 DIAGNOSIS — T63451D Toxic effect of venom of hornets, accidental (unintentional), subsequent encounter: Secondary | ICD-10-CM | POA: Diagnosis not present

## 2014-01-31 ENCOUNTER — Encounter: Payer: Self-pay | Admitting: Sports Medicine

## 2014-01-31 ENCOUNTER — Ambulatory Visit (INDEPENDENT_AMBULATORY_CARE_PROVIDER_SITE_OTHER): Payer: Medicare Other | Admitting: Sports Medicine

## 2014-01-31 VITALS — BP 137/64 | Ht 63.0 in | Wt 130.0 lb

## 2014-01-31 DIAGNOSIS — M75102 Unspecified rotator cuff tear or rupture of left shoulder, not specified as traumatic: Secondary | ICD-10-CM | POA: Diagnosis present

## 2014-01-31 DIAGNOSIS — M25562 Pain in left knee: Secondary | ICD-10-CM | POA: Diagnosis not present

## 2014-01-31 NOTE — Patient Instructions (Signed)
Continue the "[employer]" Exercises. Do not do any of the strengthening exercises that cause pain at more than 3-4/10.  Limit your walking to 3-4 miles per session. If you want to walk more break it up to multiple times per day.  Trying to do the exercises bike and or eliptical  More often will be beneficial

## 2014-01-31 NOTE — Progress Notes (Signed)
Michelle Reeves - 79 y.o. female MRN [id number]  Date of birth: [birth year]  CC & HPI:  Michelle Reeves is a very pleasant 79yo here for re-evaluation of: Left Shoulder Pain: Reports overall her pain is essentially unchanged but she does have improved function with her left shoulder including with internal rotation and overhead motion. The pain is not keeping her up at night but does cause discomfort throughout the day. She has been using the nitroglycerin patch without side effect and is placing the patch over the proximal aspect of her upper arm, Not directly over the shoulder.   Left Knee Pain: Persistent knee pain after walking 5-6 miles. She does report improvement with the body helix but symptoms return with exertion. Symptoms essentially resolved when she finishes walking. She denies any clicking, popping, locking or giving way. There is some coarseness. She has been doing some elliptical and exercise bike in addition to walking most days of the week  ROS:  Per HPI.   HISTORY: Past Medical, Surgical, Social, and Family History Reviewed & Updated per EMR.  Pertinent Historical Findings include: Relatively healthy, well controlled hypertension and hypothyroidism. Former smoker   Historical Data Reviewed: X-ray obtained after last visit on 12/19/2013: Mild degenerative changes of the before meals joint, high riding humeral head  OBJECTIVE:  VS:   HT:5\' 3"  (160 cm)   WT:130 lb (58.968 kg)  BMI:23.1          BP:137/64 mmHg  HR: bpm  TEMP: ( )  RESP:   PHYSICAL EXAM: GENERAL: elderly  female. In no discomfort; no respiratory distress   PSYCH: alert and appropriate, good insight   NEURO: sensation is intact to light touch in bilateral upper extremities  VASCULAR: left radial  pulses 2+/4.  No significant edema.    LEFT SHOULDER EXAM: Appearance: overall normal-appearing. No significant deformity.  Skin: No overlying erythema/ecchymosis.  Palpation: TTP over: Minimally over left before meals joint  and biceps tendon  No TTP over: Brachial plexus  Strength, ROM & OtherTests: full unrestricted range of motion. For out of 5 strength with internal rotation, external rotation, empty can. No pain with O'Brien's test. Negative Hawkins, negative Neers    LEFT KNEE EXAM: Overall normal-appearing. No malalignment. Mild to moderate effusion on exam. Minimal patellar crepitation. Stable to varus and valgus strain. Negative McMurray's.    Limited MSK Ultrasound of LEFT SHOULDER: Findings:  biceps tendon is intact, small amount of hypoechoic fluid surrounding it. There is a large hypoechoic change within the subscapularis muscle with training at the insertion. No full-thickness tear. Supraspinatus has degenerative fraying with no full-thickness tear or retraction there is significant thickening. Infraspinatus and teres minor overall normal-appearing. There significant degenerative changes of the ACjoint with spurring  Impression: The above findings are consistent with chronic rotator cuff tendonopathy of subscap and supraspinatus.  Degenerative changes of AC joint   ASSESSMENT: 1. Rotator cuff tear, left   2. Left knee pain    Degenerative changes of both shoulder and knee likely contributing.    PLAN: See problem based charting & AVS for additional documentation.  Titrate nitroglycerin patch to half of patch. Discussed appropriate placement over the area of targeted healing and not on the proximal distal arm.  TherEx: Continue guaiac and we'll and internal and external rotation but with light weight. Avoiding any overhead motion or excessive external rotation. Limited external rotation to no more than 30.  Recommend limiting walking to no more than 3-4 miles at a time as this  seems to be exacerbating her pain and likely is what is leading to her effusion and symptoms. Her pain begins typically after 5-6 miles and would recommend stopping before pain begins. Okay to continue with crosstraining on  bicycle or elliptical. > Return in about 6 weeks (around 03/14/2014) for repeat ultrasound.

## 2014-02-19 DIAGNOSIS — M2010 Hallux valgus (acquired), unspecified foot: Secondary | ICD-10-CM | POA: Diagnosis not present

## 2014-02-19 DIAGNOSIS — I739 Peripheral vascular disease, unspecified: Secondary | ICD-10-CM | POA: Diagnosis not present

## 2014-02-19 DIAGNOSIS — L84 Corns and callosities: Secondary | ICD-10-CM | POA: Diagnosis not present

## 2014-03-08 DIAGNOSIS — T63451D Toxic effect of venom of hornets, accidental (unintentional), subsequent encounter: Secondary | ICD-10-CM | POA: Diagnosis not present

## 2014-03-14 ENCOUNTER — Ambulatory Visit (INDEPENDENT_AMBULATORY_CARE_PROVIDER_SITE_OTHER): Payer: Medicare Other | Admitting: Sports Medicine

## 2014-03-14 ENCOUNTER — Encounter: Payer: Self-pay | Admitting: Sports Medicine

## 2014-03-14 VITALS — BP 144/82 | HR 73 | Ht 63.0 in | Wt 130.0 lb

## 2014-03-14 DIAGNOSIS — M25562 Pain in left knee: Secondary | ICD-10-CM | POA: Diagnosis not present

## 2014-03-14 DIAGNOSIS — M75102 Unspecified rotator cuff tear or rupture of left shoulder, not specified as traumatic: Secondary | ICD-10-CM | POA: Diagnosis not present

## 2014-03-14 NOTE — Progress Notes (Signed)
   Subjective:    Patient ID: Michelle Reeves, female    DOB: 1935/02/04, 80 y.o.   MRN: [id number]  HPI Mrs. Arntz is a 80 year old who presents today for follow up on her left rotator cuff tear (subscapularis) and left knee pain. She reports her shoulder has improved slightly since her visit on December 3rd. She has been wearing 1/2 nitro patch regularly without any side effects. She has been performing her shoulder exercises regularly without much pain. She reports a good range of motion. She denies any numbness or tingling down her arm. Night pain has resolved.  Her left knee is doing much better since changing her activity. She is no longer walking 5 - 6 miles a day due to the cold weather as she was earlier. She is on the elliptical for 45 minutes a day and the recumbent bike for 15 minutes a day. She denies any knee pain currently. She is wearing her compression sleeve with activity. This helps.   Review of Systems     Objective:   Physical Exam  Well developed. Well nourished. No acute distress.  BP 144/82 mmHg  Pulse 73  Ht 5\' 3"  (1.6 m)  Wt 130 lb (58.968 kg)  BMI 23.03 kg/m2  Left Shoulder: No swelling or bruising noted. Non-tender to palpation. Shoulder flexion, extension and abduction ROM intact. Pain experienced at 120 degrees of shoulder abduction. Pain with external rotation of shoulder. Negative hawkins test. Negative Yergason's test. Negative speed's test. Negative empty can test. Strength grossly intact compared to right shoulder. Neurovascularly intact distally.   Of note, her physical exam results are equivocal and at times because she has trouble elucidating what is painful.   Right shoulder: No swelling or bruising noted. Non-tender to palpation. Shoulder flexion, extension and abduction ROM intact. Normal strength in shoulder.   Left Knee: No swelling or bruising noted. Non-tender to palpation. Normal knee extension and flexion range of motion. Normal strength with knee  flexion and extension. Negative McMurray's test.   Right knee: No swelling or bruising noted. Non-tender to palpation. Normal knee extension and flexion range of motion. Normal strength with knee flexion and extension. Negative McMurray's test.     Ultrasound: Subcapularis tendon still show a central tear but is smaller and less neovascular change.  Only Mild changes at BT with less hypoechoic area around tendon.Marland Kitchen  ST/ IS/TM are OK;  AC joint DJD    Assessment & Plan:

## 2014-03-14 NOTE — Assessment & Plan Note (Addendum)
Ultrasound demonstrates improved healing of subscapularis tendon. Symptoms are less as well.  Continue with NG 1/2 patch daily to help healing.  Continue with the therapeutic shoulder exercises.  Follow up in 2 months to assess improvement.

## 2014-03-14 NOTE — Assessment & Plan Note (Signed)
Continue wearing compressive sleeve with activity.  Continue using elliptical and recumbent bike. Avoid walking 6 miles a day since it causes your knee pain.

## 2014-04-05 DIAGNOSIS — T63451D Toxic effect of venom of hornets, accidental (unintentional), subsequent encounter: Secondary | ICD-10-CM | POA: Diagnosis not present

## 2014-04-08 DIAGNOSIS — H2513 Age-related nuclear cataract, bilateral: Secondary | ICD-10-CM | POA: Diagnosis not present

## 2014-04-23 DIAGNOSIS — T63451D Toxic effect of venom of hornets, accidental (unintentional), subsequent encounter: Secondary | ICD-10-CM | POA: Diagnosis not present

## 2014-04-23 DIAGNOSIS — J3081 Allergic rhinitis due to animal (cat) (dog) hair and dander: Secondary | ICD-10-CM | POA: Diagnosis not present

## 2014-04-23 DIAGNOSIS — J301 Allergic rhinitis due to pollen: Secondary | ICD-10-CM | POA: Diagnosis not present

## 2014-04-23 DIAGNOSIS — J3089 Other allergic rhinitis: Secondary | ICD-10-CM | POA: Diagnosis not present

## 2014-04-29 ENCOUNTER — Other Ambulatory Visit: Payer: Self-pay | Admitting: *Deleted

## 2014-04-29 DIAGNOSIS — M75102 Unspecified rotator cuff tear or rupture of left shoulder, not specified as traumatic: Secondary | ICD-10-CM

## 2014-04-29 MED ORDER — NITROGLYCERIN 0.2 MG/HR TD PT24: MEDICATED_PATCH | TRANSDERMAL | Status: DC

## 2014-05-01 ENCOUNTER — Other Ambulatory Visit: Payer: Self-pay | Admitting: *Deleted

## 2014-05-01 DIAGNOSIS — M75102 Unspecified rotator cuff tear or rupture of left shoulder, not specified as traumatic: Secondary | ICD-10-CM

## 2014-05-01 MED ORDER — NITROGLYCERIN 0.2 MG/HR TD PT24: MEDICATED_PATCH | TRANSDERMAL | Status: DC

## 2014-05-15 ENCOUNTER — Ambulatory Visit (INDEPENDENT_AMBULATORY_CARE_PROVIDER_SITE_OTHER): Payer: Medicare Other | Admitting: Sports Medicine

## 2014-05-15 ENCOUNTER — Encounter: Payer: Self-pay | Admitting: Sports Medicine

## 2014-05-15 VITALS — BP 140/62 | HR 67 | Ht 63.0 in | Wt 130.0 lb

## 2014-05-15 DIAGNOSIS — M25512 Pain in left shoulder: Secondary | ICD-10-CM

## 2014-05-15 DIAGNOSIS — M75102 Unspecified rotator cuff tear or rupture of left shoulder, not specified as traumatic: Secondary | ICD-10-CM | POA: Diagnosis not present

## 2014-05-15 DIAGNOSIS — M25562 Pain in left knee: Secondary | ICD-10-CM | POA: Diagnosis not present

## 2014-05-15 NOTE — Assessment & Plan Note (Addendum)
Continue wearing compressive sleeve with activity.  Continue using elliptical and recumbent bike. Avoid walking 6 miles a day since it causes your knee pain.   I suspect she has some underlying mild dementia and walking is a relief for this.  This does make Hx difficult.

## 2014-05-15 NOTE — Assessment & Plan Note (Signed)
This is resolving well with NTG protocol and HEP

## 2014-05-15 NOTE — Progress Notes (Signed)
   Subjective:    Patient ID: Michelle Reeves, female    DOB: 06-Jul-1934, 80 y.o.   MRN: [id number]  HPI Michelle Reeves is a 80 year old who presents today for follow up on her left rotator cuff tear (subscapularis) and left knee pain. She reports her shoulder has improved slightly since her last visit. She has been wearing 1/2 nitro patch regularly without any side effects. She has been performing her shoulder exercises regularly without much pain. She reports a good range of motion. She denies any numbness or tingling down her arm. Night pain is persistent.  Her left knee is doing much better since changing her activity. She is no longer walking 5 - 6 miles a day due to the cold weather as she was earlier. She is on the elliptical for exercise. She denies any knee pain currently. She is wearing her compression sleeve with activity. This helps.  Review of Systems negative apart from HPI     Objective:   Physical Exam  Well developed. Well nourished. No acute distress.  BP 140/62 mmHg  Pulse 67  Ht 5\' 3"  (1.6 m)  Wt 130 lb (58.968 kg)  BMI 23.03 kg/m2  Left Shoulder: No swelling or bruising noted. Non-tender to palpation. Shoulder flexion and abduction ROM intact. Decreased external and internal rotation and very mild pain with both. Mild pain with hawkins test. Negative speed's test. Negative empty can test. Strength grossly intact compared to right shoulder. Neurovascularly intact distally.   Of note, her physical exam results are equivocal and at times because she has trouble elucidating what is painful.   Right shoulder: No swelling or bruising noted. Non-tender to palpation. Shoulder flexion, extension and abduction ROM intact. Normal strength in shoulder.   Left Knee: No swelling or bruising noted. Non-tender to palpation. Normal knee extension and flexion range of motion. Normal strength with knee flexion and extension. Negative McMurray's test.   [place]: Interval healing of subscapularis.   Improved homogeneity with no evidence of new tear or injury.  Width of Supraspinatus increased.  Distal retraction has resolved. Note some mild DJD of humeral head.    Assessment & Plan:   Problem List Items Addressed This Visit    Left knee pain - Primary    Continue wearing compressive sleeve with activity.  Continue using elliptical and recumbent bike. Avoid walking 6 miles a day since it causes your knee pain.         Left shoulder pain    Ultrasound demonstrates improved healing of subscapularis tendon and increased width of supraspinatus 2/2 strengthening exercises. Symptoms are less as well apart from pain at night.  Continue with NG 1/2 patch daily to help healing.  Continue with the therapeutic shoulder exercises, reviewed with patient.  Follow up in 2 months to assess improvement.           Janan Halter PGY2 [facility]

## 2014-05-15 NOTE — Assessment & Plan Note (Signed)
Ultrasound demonstrates improved healing of subscapularis tendon and increased width of supraspinatus 2/2 strengthening exercises. Symptoms are less as well apart from pain at night.  Continue with NG 1/2 patch daily to help healing.  Continue with the therapeutic shoulder exercises, reviewed with patient.  Follow up in 2 months to assess improvement.

## 2014-06-03 DIAGNOSIS — T63451D Toxic effect of venom of hornets, accidental (unintentional), subsequent encounter: Secondary | ICD-10-CM | POA: Diagnosis not present

## 2014-06-11 DIAGNOSIS — L84 Corns and callosities: Secondary | ICD-10-CM | POA: Diagnosis not present

## 2014-06-11 DIAGNOSIS — M216X2 Other acquired deformities of left foot: Secondary | ICD-10-CM | POA: Diagnosis not present

## 2014-06-11 DIAGNOSIS — I739 Peripheral vascular disease, unspecified: Secondary | ICD-10-CM | POA: Diagnosis not present

## 2014-06-18 DIAGNOSIS — T63451D Toxic effect of venom of hornets, accidental (unintentional), subsequent encounter: Secondary | ICD-10-CM | POA: Diagnosis not present

## 2014-07-01 DIAGNOSIS — T63451D Toxic effect of venom of hornets, accidental (unintentional), subsequent encounter: Secondary | ICD-10-CM | POA: Diagnosis not present

## 2014-07-09 DIAGNOSIS — T63441D Toxic effect of venom of bees, accidental (unintentional), subsequent encounter: Secondary | ICD-10-CM | POA: Diagnosis not present

## 2014-07-17 ENCOUNTER — Other Ambulatory Visit: Payer: Self-pay | Admitting: *Deleted

## 2014-07-17 ENCOUNTER — Ambulatory Visit (INDEPENDENT_AMBULATORY_CARE_PROVIDER_SITE_OTHER): Payer: Medicare Other | Admitting: Sports Medicine

## 2014-07-17 ENCOUNTER — Encounter: Payer: Self-pay | Admitting: Sports Medicine

## 2014-07-17 VITALS — BP 124/56 | Ht 63.0 in | Wt 130.0 lb

## 2014-07-17 DIAGNOSIS — M75102 Unspecified rotator cuff tear or rupture of left shoulder, not specified as traumatic: Secondary | ICD-10-CM

## 2014-07-17 MED ORDER — NITROGLYCERIN 0.2 MG/HR TD PT24: MEDICATED_PATCH | TRANSDERMAL | Status: DC

## 2014-07-17 NOTE — Progress Notes (Signed)
   Subjective:    Patient ID: Michelle Reeves, female    DOB: 1934-11-07, 80 y.o.   MRN: [id number]  HPI  Treacy presents for follow-up of left shoulder pain. Today, she says that her left shoulder pain has completely resolved. She has been wearing one half nitroglycerin patch once daily. She has been on the nitroglycerin patch for approximately 5 months now. She had been doing shoulder rehabilitation exercises. She denies any numbness, tingling, or weakness. She is doing all of her regular activities with the left shoulder.  Her knee pain has improved as well. She wears a body helix compression sleeve. She has moderated the distance she is walking to a 2 mile maximum, which has helped. She is accompanied by her husband today. Recall that we think that she may have some mild early dementia, and that walking is a potential stress reliever for her.  Past medical history, social history, medications, and allergies were reviewed and are up to date in the chart.  Review of Systems 7 point review of systems was performed and was otherwise negative unless noted in the history of present illness.     Objective:   Physical Exam BP 124/56 mmHg  Ht 5\' 3"  (1.6 m)  Wt 130 lb (58.968 kg)  BMI 23.03 kg/m2 GEN: The patient is well-developed well-nourished female and in no acute distress.  She is awake alert and oriented x3. Very agreeable affect.  SKIN: warm and well-perfused, no rash  Neuro: Strength 5/5 globally. Sensation intact throughout. No focal deficits. Vasc: +2 bilateral distal pulses.  MSK: Examination of the left shoulder reveals full range of motion with flexion, abduction, internal rotation with extension, and abducted external rotation. Rotator cuff strength testing reveals full strength without pain. No impingement signs. Negative speeds. Negative Jobes. Negative abdominal compression test.     Assessment & Plan:  Please see problem based assessment and plan in the problem list.

## 2014-07-17 NOTE — Assessment & Plan Note (Signed)
Your shoulder is healing as expected. -Continue the nitroglycerin patch once a day for the next 1 month. -Then, 1 month from now, reduce to wearing the patch every other day for 2 weeks, then discontinue it. -In general, maintaining light aerobic activity in moderation is beneficial for you. -Follow-up if you need [place].

## 2014-07-17 NOTE — Patient Instructions (Signed)
Your shoulder is healing as expected. -Continue the nitroglycerin patch once a day for the next 1 month. -Then, 1 month from now, reduce to wearing the patch every other day for 2 weeks, then discontinue it. -In general, maintaining light aerobic activity in moderation is beneficial for you. -Wear the knee sleeve when active -Follow-up if you need [place]. It was good to see you!

## 2014-07-19 ENCOUNTER — Other Ambulatory Visit: Payer: Self-pay | Admitting: *Deleted

## 2014-07-19 DIAGNOSIS — M75102 Unspecified rotator cuff tear or rupture of left shoulder, not specified as traumatic: Secondary | ICD-10-CM

## 2014-07-19 MED ORDER — NITROGLYCERIN 0.2 MG/HR TD PT24: MEDICATED_PATCH | TRANSDERMAL | Status: DC

## 2014-08-08 DIAGNOSIS — T63441D Toxic effect of venom of bees, accidental (unintentional), subsequent encounter: Secondary | ICD-10-CM | POA: Diagnosis not present

## 2014-08-13 DIAGNOSIS — T63451D Toxic effect of venom of hornets, accidental (unintentional), subsequent encounter: Secondary | ICD-10-CM | POA: Diagnosis not present

## 2014-08-26 ENCOUNTER — Other Ambulatory Visit: Payer: Self-pay

## 2014-08-27 DIAGNOSIS — R413 Other amnesia: Secondary | ICD-10-CM | POA: Diagnosis not present

## 2014-08-29 DIAGNOSIS — L84 Corns and callosities: Secondary | ICD-10-CM | POA: Diagnosis not present

## 2014-08-29 DIAGNOSIS — L603 Nail dystrophy: Secondary | ICD-10-CM | POA: Diagnosis not present

## 2014-08-29 DIAGNOSIS — I739 Peripheral vascular disease, unspecified: Secondary | ICD-10-CM | POA: Diagnosis not present

## 2014-09-05 ENCOUNTER — Ambulatory Visit (INDEPENDENT_AMBULATORY_CARE_PROVIDER_SITE_OTHER): Payer: Medicare Other | Admitting: Neurology

## 2014-09-05 ENCOUNTER — Encounter: Payer: Self-pay | Admitting: Neurology

## 2014-09-05 ENCOUNTER — Other Ambulatory Visit: Payer: 59

## 2014-09-05 VITALS — BP 168/88 | HR 84 | Resp 20 | Ht 63.0 in | Wt 145.5 lb

## 2014-09-05 DIAGNOSIS — I10 Essential (primary) hypertension: Secondary | ICD-10-CM | POA: Diagnosis not present

## 2014-09-05 DIAGNOSIS — R413 Other amnesia: Secondary | ICD-10-CM
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
## 2014-09-05 NOTE — Patient Instructions (Addendum)
1.  We will start you on the Namenda XR starter kit.  Take as directed.  Call for refill to continue.  Side effects may include dizziness, headache, diarrhea or constipation. 2.  In 4 weeks, call with update.  If doing well, we will start another medication called Aricept. 3.  We will check MRI of the brain and vitamin B12 level [facility]  09/12/14 at 4:45pm  4.  Restrict driving only to daylight hours, to only familiar places such as the grocery store, and off of highways. 5.  Follow up in 6 months.

## 2014-09-06 LAB — VITAMIN B12: VITAMIN B 12: 658 pg/mL (ref 211–911)

## 2014-09-09 ENCOUNTER — Telehealth: Payer: Self-pay | Admitting: *Deleted

## 2014-09-09 NOTE — Telephone Encounter (Signed)
Patient is aware b12 is normal

## 2014-09-09 NOTE — Telephone Encounter (Signed)
-----   Message from Pieter Partridge, DO sent at 09/08/2014 11:39 AM EDT ----- b12 is normal

## 2014-09-12 ENCOUNTER — Ambulatory Visit (HOSPITAL_COMMUNITY)
Admission: RE | Admit: 2014-09-12 | Discharge: 2014-09-12 | Disposition: A | Discharge status: Home / Self Care | Payer: Medicare Other | Source: Ambulatory Visit | Attending: Neurology | Admitting: Neurology

## 2014-09-12 DIAGNOSIS — Z8673 Personal history of transient ischemic attack (TIA), and cerebral infarction without residual deficits: Secondary | ICD-10-CM | POA: Diagnosis not present

## 2014-09-12 DIAGNOSIS — R9082 White matter disease, unspecified: Secondary | ICD-10-CM | POA: Insufficient documentation

## 2014-09-12 DIAGNOSIS — R413 Other amnesia: Secondary | ICD-10-CM

## 2014-09-12 DIAGNOSIS — R930 Abnormal findings on diagnostic imaging of skull and head, not elsewhere classified: Secondary | ICD-10-CM | POA: Diagnosis not present

## 2014-09-12 DIAGNOSIS — G319 Degenerative disease of nervous system, unspecified: Secondary | ICD-10-CM | POA: Insufficient documentation

## 2014-09-12 LAB — CREATININE, SERUM
Creatinine, Ser: 1.04 mg/dL — ABNORMAL HIGH (ref 0.44–1.00)
GFR, EST AFRICAN AMERICAN: 58 mL/min — AB (ref >60)
GFR, EST NON AFRICAN AMERICAN: 50 mL/min — AB (ref >60)

## 2014-09-12 MED ORDER — GADOBENATE DIMEGLUMINE 529 MG/ML IV SOLN
15.0000 mL | Freq: Once | INTRAVENOUS | Status: AC | PRN
  Administered 2014-09-12: 13 mL via INTRAVENOUS

## 2014-09-24 ENCOUNTER — Ambulatory Visit: Payer: 59 | Admitting: Neurology

## 2014-09-25 DIAGNOSIS — T63451D Toxic effect of venom of hornets, accidental (unintentional), subsequent encounter: Secondary | ICD-10-CM | POA: Diagnosis not present

## 2014-10-01 ENCOUNTER — Other Ambulatory Visit: Payer: Self-pay | Admitting: *Deleted

## 2014-10-01 ENCOUNTER — Telehealth: Payer: Self-pay | Admitting: *Deleted

## 2014-10-01 MED ORDER — DONEPEZIL HCL 10 MG PO TABS: ORAL_TABLET | ORAL | Status: DC

## 2014-10-01 NOTE — Telephone Encounter (Signed)
Patient is aware that medication has been e scribed per Dr Posey Pronto

## 2014-10-01 NOTE — Telephone Encounter (Signed)
Patients spouse called stating they are ready to move forward with the Aricept  Please advise on instructions and strength .Marland Kitchen

## 2014-10-01 NOTE — Telephone Encounter (Signed)
Please let them know the presription for Aricept 10mg  has been sent -  take 0.5 tab daily for 1 month, then increase to 1 tablet daily thereafter.   Halley Kincer K. Posey Pronto, DO

## 2014-10-01 NOTE — Addendum Note (Signed)
Addended by: Alda Berthold on: 10/01/2014 02:00 PM   Modules accepted: Orders

## 2014-10-02 DIAGNOSIS — T63461D Toxic effect of venom of wasps, accidental (unintentional), subsequent encounter: Secondary | ICD-10-CM | POA: Diagnosis not present

## 2014-10-07 ENCOUNTER — Telehealth: Payer: Self-pay | Admitting: Neurology

## 2014-10-07 NOTE — Telephone Encounter (Signed)
I left a message for patient that medication Aricept was called to [facility] on 10/01/14

## 2014-10-07 NOTE — Telephone Encounter (Signed)
Pt called and wanted to know if the prescription for aricept have been called in yet, checked the pharmacy and they said it was not called in yet/Dawn CB# [phone]

## 2014-11-14 ENCOUNTER — Other Ambulatory Visit: Payer: Self-pay | Admitting: *Deleted

## 2014-11-14 ENCOUNTER — Telehealth: Payer: Self-pay | Admitting: Neurology

## 2014-11-14 MED ORDER — DONEPEZIL HCL 10 MG PO TABS: ORAL_TABLET | ORAL | Status: DC

## 2014-11-14 MED ORDER — MEMANTINE HCL 10 MG PO TABS: 10.0000 mg | ORAL_TABLET | Freq: Two times a day (BID) | ORAL | Status: DC

## 2014-11-14 NOTE — Telephone Encounter (Signed)
i dont see the Namenda on her medication list

## 2014-11-14 NOTE — Telephone Encounter (Signed)
Rx sent in.  Patient started on Namenda at last visit.

## 2014-11-14 NOTE — Telephone Encounter (Signed)
Pt called and needs refills for Donepezil and Namenda called in/Dawn CB# [phone]

## 2014-11-21 DIAGNOSIS — T63451D Toxic effect of venom of hornets, accidental (unintentional), subsequent encounter: Secondary | ICD-10-CM | POA: Diagnosis not present

## 2014-11-25 DIAGNOSIS — I739 Peripheral vascular disease, unspecified: Secondary | ICD-10-CM | POA: Diagnosis not present

## 2014-11-25 DIAGNOSIS — L84 Corns and callosities: Secondary | ICD-10-CM | POA: Diagnosis not present

## 2014-11-25 DIAGNOSIS — L603 Nail dystrophy: Secondary | ICD-10-CM | POA: Diagnosis not present

## 2014-12-04 DIAGNOSIS — I10 Essential (primary) hypertension: Secondary | ICD-10-CM | POA: Diagnosis not present

## 2014-12-04 DIAGNOSIS — F0390 Unspecified dementia without behavioral disturbance: Secondary | ICD-10-CM | POA: Diagnosis not present

## 2014-12-04 DIAGNOSIS — E039 Hypothyroidism, unspecified: Secondary | ICD-10-CM | POA: Diagnosis not present

## 2014-12-04 DIAGNOSIS — Z0000 Encounter for general adult medical examination without abnormal findings: Secondary | ICD-10-CM | POA: Diagnosis not present

## 2014-12-04 DIAGNOSIS — Z23 Encounter for immunization: Secondary | ICD-10-CM | POA: Diagnosis not present

## 2014-12-04 DIAGNOSIS — E7800 Pure hypercholesterolemia, unspecified: Secondary | ICD-10-CM | POA: Diagnosis not present

## 2014-12-16 DIAGNOSIS — T63451D Toxic effect of venom of hornets, accidental (unintentional), subsequent encounter: Secondary | ICD-10-CM | POA: Diagnosis not present

## 2015-01-07 DIAGNOSIS — T63451D Toxic effect of venom of hornets, accidental (unintentional), subsequent encounter: Secondary | ICD-10-CM | POA: Diagnosis not present

## 2015-02-11 DIAGNOSIS — T63451D Toxic effect of venom of hornets, accidental (unintentional), subsequent encounter: Secondary | ICD-10-CM | POA: Diagnosis not present

## 2015-02-18 DIAGNOSIS — L84 Corns and callosities: Secondary | ICD-10-CM | POA: Diagnosis not present

## 2015-02-18 DIAGNOSIS — L603 Nail dystrophy: Secondary | ICD-10-CM | POA: Diagnosis not present

## 2015-02-18 DIAGNOSIS — I739 Peripheral vascular disease, unspecified: Secondary | ICD-10-CM | POA: Diagnosis not present

## 2015-03-12 ENCOUNTER — Ambulatory Visit (INDEPENDENT_AMBULATORY_CARE_PROVIDER_SITE_OTHER): Payer: Medicare Other | Admitting: Neurology

## 2015-03-12 ENCOUNTER — Encounter: Payer: Self-pay | Admitting: Neurology

## 2015-03-12 VITALS — BP 132/76 | HR 73 | Ht 63.0 in | Wt 143.0 lb

## 2015-03-12 DIAGNOSIS — F0280 Dementia in other diseases classified elsewhere without behavioral disturbance: Secondary | ICD-10-CM | POA: Diagnosis not present

## 2015-03-12 DIAGNOSIS — G309 Alzheimer's disease, unspecified: Secondary | ICD-10-CM | POA: Diagnosis not present

## 2015-03-12 DIAGNOSIS — M25512 Pain in left shoulder: Secondary | ICD-10-CM
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
## 2015-03-12 NOTE — Progress Notes (Signed)
Chart forwarded.  

## 2015-03-12 NOTE — Patient Instructions (Signed)
Continue Aricept 10mg  daily and Namenda 10mg  twice daily Follow up in 9 months

## 2015-03-14 ENCOUNTER — Encounter: Payer: Self-pay | Admitting: Sports Medicine

## 2015-03-14 ENCOUNTER — Ambulatory Visit (INDEPENDENT_AMBULATORY_CARE_PROVIDER_SITE_OTHER): Payer: Medicare Other | Admitting: Sports Medicine

## 2015-03-14 VITALS — BP 142/80 | Ht 63.0 in | Wt 140.0 lb

## 2015-03-14 DIAGNOSIS — M25512 Pain in left shoulder: Secondary | ICD-10-CM

## 2015-03-14 MED ORDER — NITROGLYCERIN 0.2 MG/HR TD PT24: MEDICATED_PATCH | TRANSDERMAL | Status: DC

## 2015-03-15 NOTE — Progress Notes (Signed)
   Subjective:    Patient ID: Michelle Reeves, female    DOB: 05/10/1934, 81 y.o.   MRN: [id number]  HPI Michelle Reeves is a 81 year old femaleWith Alzheimer's dementia who presents with left shoulder pain. She is accompanied by her husband who is helping to fill in missing details.  She complains of left shoulder pain for the last month. There is no known trauma but he remembers her waking up since she was in significant pain.  S Further shoulder goes, movements such as lifting or reaching behind her and aggravate her symptoms.  Character of pain as an aching.  Location of pain is primarily in the lateral left shoulder.  She denies any radiation.  She rates the severity 4/10.  She has tried ibuprofen 200 mg once daily some relief.   Past medical, social, medications, and allergies are reviewed and are up-to-date in chart.  Review of Systems 11 point review of systems was performed is otherwise negative him started in the above history of present illness.    Objective:   Physical Exam BP 142/80 mmHg  Ht 5\' 3"  (1.6 m)  Wt 140 lb (63.504 kg)  BMI 24.81 kg/m2 GEN: The patient is well-developed well-nourished female and in no acute distress.  She is awake alert and oriented x3. SKIN: warm and well-perfused, no rash  EXTR: No lower extremity edema or calf tenderness Neuro: Strength 5/5 globally. Sensation intact throughout. DTRs 2/4 bilaterally. No focal deficits. Vasc: +2 bilateral distal pulses. No edema.  MSK:   Examination of the left shoulder. Exam is difficult due to mental state and difficult to determine level of pain reveals range of motion is the following: Flexion: 170 degrees Abduction: 170 degrees Int rot/ext: L1 Ad ER: 45 degrees Ab ER: 90 degrees Ab Int Rot: 45 degrees Special Tests: +Jobe's test, +Hawkin's. Neg Speed's. Good intrinsic rotator cuff strength except with supraspinatus testing. No TTP at [facility] joint.  Limited musculoskeletal ultrasound: Limited musculoskeletal ultrasound  was performed of the left knee and left shoulder in long and short axisWereobtained.  The left biceps appears normal.  The left subscapularis muscle and tendon appeared normal.  Shows a full thickness supraspinatus tear .  Her infraspinatus several areas of hypoechoic change But no discrete tearing.       Assessment & Plan:  1. Left shoulder full-thickness supraspinatus tear without retraction. -She'll start on the Nitroglycerin protocol.She has tolerated one half a patch daily and we have asked her to take 1/4 for 1 week and then increase to 2 1/4 patches in different locaitons.  They were advised To monitor for any dizziness. -Report provided her a Thera-Band and with shoulder rehabilitation exercises. -She'll follow up in 4-6 weeks or sooner if needed.

## 2015-03-15 NOTE — Assessment & Plan Note (Signed)
Nitroglycerin protocol and rehabilitation. Followup in 6-8 weeks.

## 2015-04-09 ENCOUNTER — Encounter: Payer: Self-pay | Admitting: Neurology

## 2015-04-14 ENCOUNTER — Telehealth: Payer: Self-pay | Admitting: Neurology

## 2015-04-14 ENCOUNTER — Other Ambulatory Visit: Payer: Self-pay | Admitting: Neurology

## 2015-04-14 NOTE — Telephone Encounter (Signed)
VM-PT left message that she needs 2 refills called in to [facility] CB# [phone]

## 2015-04-14 NOTE — Telephone Encounter (Signed)
Rx sent 

## 2015-04-14 NOTE — Telephone Encounter (Signed)
RX previously sent in electronically.

## 2015-04-29 DIAGNOSIS — L84 Corns and callosities: Secondary | ICD-10-CM | POA: Diagnosis not present

## 2015-04-29 DIAGNOSIS — I739 Peripheral vascular disease, unspecified: Secondary | ICD-10-CM | POA: Diagnosis not present

## 2015-04-29 DIAGNOSIS — L603 Nail dystrophy: Secondary | ICD-10-CM | POA: Diagnosis not present

## 2015-05-01 DIAGNOSIS — J301 Allergic rhinitis due to pollen: Secondary | ICD-10-CM | POA: Diagnosis not present

## 2015-05-01 DIAGNOSIS — J3081 Allergic rhinitis due to animal (cat) (dog) hair and dander: Secondary | ICD-10-CM | POA: Diagnosis not present

## 2015-05-01 DIAGNOSIS — T63451D Toxic effect of venom of hornets, accidental (unintentional), subsequent encounter: Secondary | ICD-10-CM | POA: Diagnosis not present

## 2015-05-01 DIAGNOSIS — J3089 Other allergic rhinitis: Secondary | ICD-10-CM | POA: Diagnosis not present

## 2015-05-06 ENCOUNTER — Encounter: Payer: Self-pay | Admitting: Sports Medicine

## 2015-05-06 ENCOUNTER — Ambulatory Visit (INDEPENDENT_AMBULATORY_CARE_PROVIDER_SITE_OTHER): Payer: Medicare Other | Admitting: Sports Medicine

## 2015-05-06 VITALS — BP 137/76 | HR 72 | Ht 63.0 in | Wt 140.0 lb

## 2015-05-06 DIAGNOSIS — M75102 Unspecified rotator cuff tear or rupture of left shoulder, not specified as traumatic: Secondary | ICD-10-CM

## 2015-05-06 NOTE — Progress Notes (Signed)
   Subjective:    Patient ID: Michelle Reeves, female    DOB: 04/14/1934, 81 y.o.   MRN: [id number]  HPI   Patient comes in today for follow-up on left shoulder pain. She was last seen by Dr. Jimmye Norman on January 13. An ultrasound done at that visit showed a full-thickness supraspinatus tear. She was placed on nitroglycerin and given some home exercises. She is still having pain. Most of the history is obtained from her husband as the patient has advanced Alzheimer's and is a poor historian. Per the husband's report, his wife seems to be tolerating her shoulder pain okay. Patient herself is not sure that nitroglycerin has made much difference.    Review of Systems     Objective:   Physical Exam  Pleasant. No acute distress.  Left shoulder: Full range of motion. 4/5 strength with resisted supraspinatus and 4/5 strength with resisted external rotation on the left. Mild pain with this. No tenderness over the acromioclavicular joint nor over the bicipital groove. Neurovascularly intact distally.       Assessment & Plan:   Left shoulder pain secondary to rotator cuff tear Alzheimer's Disease  I discussed a subacromial cortisone injection with the patient and her husband but neither think that her pain is severe enough at this time to warrant an injection. I think she is okay to continue with her nitroglycerin patches for another 4-6 weeks if she feels like it is helping with pain control. If not, then I would have her discontinue the patches altogether. Upon further questioning of the husband, it sounds like his wife is able to do most activities of daily living without too much difficulty. They both understand that we could reconsider a cortisone injection at a later date if her pain warrants. She is certainly not a surgical candidate. Follow-up as needed.

## 2015-06-05 DIAGNOSIS — H53021 Refractive amblyopia, right eye: Secondary | ICD-10-CM | POA: Diagnosis not present

## 2015-06-05 DIAGNOSIS — H2513 Age-related nuclear cataract, bilateral: Secondary | ICD-10-CM | POA: Diagnosis not present

## 2015-06-05 DIAGNOSIS — H5203 Hypermetropia, bilateral: Secondary | ICD-10-CM | POA: Diagnosis not present

## 2015-06-05 DIAGNOSIS — H52203 Unspecified astigmatism, bilateral: Secondary | ICD-10-CM | POA: Diagnosis not present

## 2015-06-05 DIAGNOSIS — H524 Presbyopia: Secondary | ICD-10-CM | POA: Diagnosis not present

## 2015-07-08 DIAGNOSIS — I739 Peripheral vascular disease, unspecified: Secondary | ICD-10-CM | POA: Diagnosis not present

## 2015-07-08 DIAGNOSIS — L603 Nail dystrophy: Secondary | ICD-10-CM | POA: Diagnosis not present

## 2015-07-08 DIAGNOSIS — L84 Corns and callosities: Secondary | ICD-10-CM | POA: Diagnosis not present

## 2015-08-16 ENCOUNTER — Other Ambulatory Visit: Payer: Self-pay | Admitting: Neurology

## 2015-08-18 NOTE — Telephone Encounter (Signed)
Last OV: 03/12/2015 Next OV: 12/10/15

## 2015-10-07 DIAGNOSIS — I739 Peripheral vascular disease, unspecified: Secondary | ICD-10-CM | POA: Diagnosis not present

## 2015-10-07 DIAGNOSIS — L84 Corns and callosities: Secondary | ICD-10-CM | POA: Diagnosis not present

## 2015-10-07 DIAGNOSIS — L603 Nail dystrophy: Secondary | ICD-10-CM | POA: Diagnosis not present

## 2015-11-19 ENCOUNTER — Other Ambulatory Visit: Payer: Self-pay | Admitting: Neurology

## 2015-11-19 ENCOUNTER — Telehealth: Payer: Self-pay | Admitting: Neurology

## 2015-11-19 MED ORDER — CALCIUM CARBONATE 600 MG PO TABS: 600.0000 mg | ORAL_TABLET | Freq: Two times a day (BID) | ORAL | 5 refills | Status: DC

## 2015-11-19 MED ORDER — MEMANTINE HCL 10 MG PO TABS: 10.0000 mg | ORAL_TABLET | Freq: Two times a day (BID) | ORAL | 5 refills | Status: DC

## 2015-11-19 NOTE — Telephone Encounter (Signed)
PT left a message that she needs a refill call in/[facility] CB# [phone]

## 2015-11-19 NOTE — Telephone Encounter (Signed)
Refills sent to pharmacy. 

## 2015-11-24 ENCOUNTER — Other Ambulatory Visit: Payer: Self-pay

## 2015-11-24 ENCOUNTER — Telehealth: Payer: Self-pay | Admitting: Neurology

## 2015-11-24 MED ORDER — DONEPEZIL HCL 10 MG PO TABS: 10.0000 mg | ORAL_TABLET | Freq: Every day | ORAL | 5 refills | Status: DC

## 2015-11-24 NOTE — Telephone Encounter (Signed)
[facility] accidentally reordered. Advised it was an error. Husband verbalized understanding. Stated that pt is declining fairly quickly at this point he feels. Pt scheduled for 12/10/15. FYI only

## 2015-11-24 NOTE — Telephone Encounter (Signed)
PT's husband Legrand Como called in regards to a prescription that was called into [facility] CB# [phone] or [phone]

## 2015-12-03 DIAGNOSIS — Z23 Encounter for immunization: Secondary | ICD-10-CM | POA: Diagnosis not present

## 2015-12-04 DIAGNOSIS — J301 Allergic rhinitis due to pollen: Secondary | ICD-10-CM | POA: Diagnosis not present

## 2015-12-04 DIAGNOSIS — J3081 Allergic rhinitis due to animal (cat) (dog) hair and dander: Secondary | ICD-10-CM | POA: Diagnosis not present

## 2015-12-04 DIAGNOSIS — T63451D Toxic effect of venom of hornets, accidental (unintentional), subsequent encounter: Secondary | ICD-10-CM | POA: Diagnosis not present

## 2015-12-04 DIAGNOSIS — J3089 Other allergic rhinitis: Secondary | ICD-10-CM | POA: Diagnosis not present

## 2015-12-10 ENCOUNTER — Ambulatory Visit (INDEPENDENT_AMBULATORY_CARE_PROVIDER_SITE_OTHER): Payer: Medicare Other | Admitting: Neurology

## 2015-12-10 ENCOUNTER — Encounter: Payer: Self-pay | Admitting: Neurology

## 2015-12-10 VITALS — BP 136/64 | HR 74 | Wt 136.9 lb

## 2015-12-10 DIAGNOSIS — F0280 Dementia in other diseases classified elsewhere without behavioral disturbance: Secondary | ICD-10-CM | POA: Diagnosis not present

## 2015-12-10 DIAGNOSIS — G308 Other Alzheimer's disease: Secondary | ICD-10-CM | POA: Diagnosis not present

## 2015-12-10 MED ORDER — MEMANTINE HCL-DONEPEZIL HCL ER 28-10 MG PO CP24: 1.0000 | ORAL_CAPSULE | Freq: Every day | ORAL | 8 refills | Status: DC

## 2015-12-10 NOTE — Progress Notes (Signed)
Chart forwarded.  

## 2015-12-10 NOTE — Patient Instructions (Signed)
1.  Stop donepezil and memantine.  Instead, start Namzaric 1 capsule daily (it includes both donepezil and memantine together) 2.  Will get Home Health Assessment 3.  You should continue walking but with somebody. 4.  Establish a power of attorney 5.  Follow up in 9 months.
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
## 2015-12-11 DIAGNOSIS — T63451D Toxic effect of venom of hornets, accidental (unintentional), subsequent encounter: Secondary | ICD-10-CM | POA: Diagnosis not present

## 2015-12-15 ENCOUNTER — Telehealth: Payer: Self-pay

## 2015-12-15 DIAGNOSIS — G309 Alzheimer's disease, unspecified: Principal | ICD-10-CM

## 2015-12-15 DIAGNOSIS — F0280 Dementia in other diseases classified elsewhere without behavioral disturbance: Secondary | ICD-10-CM

## 2015-12-15 NOTE — Telephone Encounter (Signed)
PT orders places.

## 2015-12-17 ENCOUNTER — Telehealth: Payer: Self-pay

## 2015-12-17 DIAGNOSIS — G309 Alzheimer's disease, unspecified: Principal | ICD-10-CM

## 2015-12-17 DIAGNOSIS — F0280 Dementia in other diseases classified elsewhere without behavioral disturbance: Secondary | ICD-10-CM

## 2015-12-17 NOTE — Telephone Encounter (Signed)
Amy, PT from [place], called. Stated she went out to perform a safety evaluation. Pt was not even present at scheduled time, she was out on a 5 mile walk. Husband was tracking her via his phone. He stated you did not want him doing that, but it was the only option he had. PT did not feel that there was anything that would need home health, but did feel that pt would benefit from outpatient PT/SP (speech therapy was at husband's request) Okay to place orders? Please advise.

## 2015-12-17 NOTE — Telephone Encounter (Signed)
Yes, please.

## 2015-12-17 NOTE — Telephone Encounter (Signed)
Detailed message left for husband letting him know about the referrals be place on behalf of Amy, PT,

## 2015-12-30 DIAGNOSIS — L84 Corns and callosities: Secondary | ICD-10-CM | POA: Diagnosis not present

## 2015-12-30 DIAGNOSIS — L603 Nail dystrophy: Secondary | ICD-10-CM | POA: Diagnosis not present

## 2015-12-30 DIAGNOSIS — I739 Peripheral vascular disease, unspecified: Secondary | ICD-10-CM | POA: Diagnosis not present

## 2016-03-25 ENCOUNTER — Telehealth: Payer: Self-pay | Admitting: Neurology

## 2016-03-25 NOTE — Telephone Encounter (Signed)
PT's husband called and wanted to get a 90 day supply of medication Donepezil instead of a 30 day supply/[facility] CB# [phone]

## 2016-03-26 MED ORDER — DONEPEZIL HCL 10 MG PO TABS: 10.0000 mg | ORAL_TABLET | Freq: Every day | ORAL | 0 refills | Status: DC

## 2016-03-26 MED ORDER — MEMANTINE HCL 10 MG PO TABS: 10.0000 mg | ORAL_TABLET | Freq: Two times a day (BID) | ORAL | 0 refills | Status: DC

## 2016-03-26 NOTE — Telephone Encounter (Signed)
Pt spouse requesting to switch back to patient taking memantine and donepezil separately.  Requesting refill on memantine. Wanted Dr. Tomi Likens to know patient does go to [facility] x1 day a week now.

## 2016-03-26 NOTE — Telephone Encounter (Signed)
That would be okay. 1.  She can take donepezil 10mg  at bedtime 2.  For memantine, she may return to 10mg  twice daily; or we can prescribe her Namenda XR 28mg  daily (which would be only once a day dosing).

## 2016-03-26 NOTE — Telephone Encounter (Signed)
Spoke to spouse. Gave med instructions. Spouse verbalized understanding. Does not want the Namenda XR. Sent Rx for memantine and donepezil.

## 2016-03-30 DIAGNOSIS — I739 Peripheral vascular disease, unspecified: Secondary | ICD-10-CM | POA: Diagnosis not present

## 2016-03-30 DIAGNOSIS — L84 Corns and callosities: Secondary | ICD-10-CM | POA: Diagnosis not present

## 2016-03-30 DIAGNOSIS — L603 Nail dystrophy: Secondary | ICD-10-CM | POA: Diagnosis not present

## 2016-06-07 DIAGNOSIS — I10 Essential (primary) hypertension: Secondary | ICD-10-CM | POA: Diagnosis not present

## 2016-06-07 DIAGNOSIS — E039 Hypothyroidism, unspecified: Secondary | ICD-10-CM | POA: Diagnosis not present

## 2016-06-07 DIAGNOSIS — E7800 Pure hypercholesterolemia, unspecified: Secondary | ICD-10-CM | POA: Diagnosis not present

## 2016-06-10 DIAGNOSIS — H2513 Age-related nuclear cataract, bilateral: Secondary | ICD-10-CM | POA: Diagnosis not present

## 2016-06-22 DIAGNOSIS — L84 Corns and callosities: Secondary | ICD-10-CM | POA: Diagnosis not present

## 2016-06-22 DIAGNOSIS — L603 Nail dystrophy: Secondary | ICD-10-CM | POA: Diagnosis not present

## 2016-06-22 DIAGNOSIS — I739 Peripheral vascular disease, unspecified: Secondary | ICD-10-CM | POA: Diagnosis not present

## 2016-08-07 ENCOUNTER — Emergency Department (HOSPITAL_COMMUNITY)
Admission: EM | Admit: 2016-08-07 | Discharge: 2016-08-08 | Disposition: A | Discharge status: Home / Self Care | Payer: Medicare Other | Attending: Emergency Medicine | Admitting: Emergency Medicine

## 2016-08-07 ENCOUNTER — Emergency Department (HOSPITAL_COMMUNITY): Payer: Medicare Other

## 2016-08-07 ENCOUNTER — Encounter (HOSPITAL_COMMUNITY): Payer: Self-pay | Admitting: Emergency Medicine

## 2016-08-07 DIAGNOSIS — Z91010 Allergy to peanuts: Secondary | ICD-10-CM | POA: Diagnosis not present

## 2016-08-07 DIAGNOSIS — Z7982 Long term (current) use of aspirin: Secondary | ICD-10-CM | POA: Diagnosis not present

## 2016-08-07 DIAGNOSIS — I10 Essential (primary) hypertension: Secondary | ICD-10-CM | POA: Diagnosis not present

## 2016-08-07 DIAGNOSIS — N3000 Acute cystitis without hematuria: Secondary | ICD-10-CM | POA: Diagnosis not present

## 2016-08-07 DIAGNOSIS — R55 Syncope and collapse: Secondary | ICD-10-CM | POA: Insufficient documentation

## 2016-08-07 DIAGNOSIS — G309 Alzheimer's disease, unspecified: Secondary | ICD-10-CM | POA: Insufficient documentation

## 2016-08-07 DIAGNOSIS — R404 Transient alteration of awareness: Secondary | ICD-10-CM | POA: Diagnosis not present

## 2016-08-07 DIAGNOSIS — G252 Other specified forms of tremor: Secondary | ICD-10-CM | POA: Diagnosis not present

## 2016-08-07 DIAGNOSIS — I700 Atherosclerosis of aorta: Secondary | ICD-10-CM | POA: Diagnosis not present

## 2016-08-07 DIAGNOSIS — Z87891 Personal history of nicotine dependence: Secondary | ICD-10-CM | POA: Diagnosis not present

## 2016-08-07 DIAGNOSIS — F0390 Unspecified dementia without behavioral disturbance: Secondary | ICD-10-CM | POA: Diagnosis not present

## 2016-08-07 LAB — CBC
HCT: 41 % (ref 36.0–46.0)
Hemoglobin: 13.2 g/dL (ref 12.0–15.0)
MCH: 28.8 pg (ref 26.0–34.0)
MCHC: 32.2 g/dL (ref 30.0–36.0)
MCV: 89.5 fL (ref 78.0–100.0)
PLATELETS: 205 10*3/uL (ref 150–400)
RBC: 4.58 MIL/uL (ref 3.87–5.11)
RDW: 13 % (ref 11.5–15.5)
WBC: 8 10*3/uL (ref 4.0–10.5)

## 2016-08-07 LAB — HEPATIC FUNCTION PANEL
ALBUMIN: 4.2 g/dL (ref 3.5–5.0)
ALT: 22 U/L (ref 14–54)
AST: 26 U/L (ref 15–41)
Alkaline Phosphatase: 58 U/L (ref 38–126)
BILIRUBIN INDIRECT: 0.3 mg/dL (ref 0.3–0.9)
Bilirubin, Direct: 0.2 mg/dL (ref 0.1–0.5)
TOTAL PROTEIN: 7.4 g/dL (ref 6.5–8.1)
Total Bilirubin: 0.5 mg/dL (ref 0.3–1.2)

## 2016-08-07 LAB — BASIC METABOLIC PANEL
ANION GAP: 7 (ref 5–15)
BUN: 18 mg/dL (ref 6–20)
CALCIUM: 9 mg/dL (ref 8.9–10.3)
CO2: 23 mmol/L (ref 22–32)
CREATININE: 0.93 mg/dL (ref 0.44–1.00)
Chloride: 105 mmol/L (ref 101–111)
GFR calc Af Amer: >60 mL/min (ref >60)
GFR, EST NON AFRICAN AMERICAN: 57 mL/min — AB (ref >60)
GLUCOSE: 184 mg/dL — AB (ref 65–99)
Potassium: 4.5 mmol/L (ref 3.5–5.1)
Sodium: 135 mmol/L (ref 135–145)

## 2016-08-07 LAB — PROTIME-INR
INR: 0.91
PROTHROMBIN TIME: 12.2 s (ref 11.4–15.2)

## 2016-08-07 MED ORDER — SODIUM CHLORIDE 0.9 % IV BOLUS (SEPSIS)
1000.0000 mL | Freq: Once | INTRAVENOUS | Status: AC
  Administered 2016-08-07: 1000 mL via INTRAVENOUS

## 2016-08-07 NOTE — ED Provider Notes (Signed)
[facility] DEPT Provider Note   CSN: [id number] Arrival date & time: 08/07/16  2114     History   Chief Complaint Chief Complaint  Patient presents with  . Near Syncope    HPI Michelle Reeves is a 82 y.o. female.  The history is provided by the spouse and a relative. The history is limited by the condition of the patient (pt nonverbal).  Near Syncope  This is a recurrent problem. The current episode started less than 1 hour ago. The problem occurs rarely. The problem has been resolved. Pertinent negatives include no chest pain, no abdominal pain, no headaches and no shortness of breath. The symptoms are aggravated by standing. The symptoms are relieved by lying down. She has tried nothing for the symptoms.    Past Medical History:  Diagnosis Date  . Hyperlipidemia   . Hypertension   . Thyroid disease     Patient Active Problem List   Diagnosis Date Noted  . Alzheimer's disease 03/12/2015  . Essential hypertension 09/05/2014  . Left knee pain 11/14/2013  . Hammertoe 11/14/2013  . SACROILIITIS 07/21/2006    Past Surgical History:  Procedure Laterality Date  . APPENDECTOMY    . COLONOSCOPY  12 years ago   done by Dr.Kaplan    OB History    No data available       Home Medications    Prior to Admission medications   Medication Sig Start Date End Date Taking? Authorizing Provider  aspirin 81 MG tablet Take 81 mg by mouth daily.      [provider]  donepezil (ARICEPT) 10 MG tablet Take 1 tablet (10 mg total) by mouth at bedtime. 03/26/16   Tomi Likens, Adam R, DO  EPINEPHrine 0.3 mg/0.3 mL IJ SOAJ injection Inject into the muscle once.    [provider]  levothyroxine (SYNTHROID, LEVOTHROID) 75 MCG tablet Take 75 mcg by mouth daily.      [provider]  losartan (COZAAR) 50 MG tablet Take 50 mg by mouth daily.      [provider]  memantine (NAMENDA) 10 MG tablet Take 1 tablet (10 mg total) by mouth 2 (two) times daily. 03/26/16    Pieter Partridge, DO  Memantine HCl-Donepezil HCl (NAMZARIC) 28-10 MG CP24 Take 1 capsule by mouth daily. 12/10/15   Pieter Partridge, DO  Multiple Vitamin (DAILY VITE PO) Take 1 tablet by mouth daily.      [provider]  simvastatin (ZOCOR) 80 MG tablet Take 80 mg by mouth daily.      [provider]    Family History Family History  Problem Relation Age of Onset  . Stomach cancer Mother   . Heart failure Brother   . Cancer Paternal Grandfather        stomach    Social History Social History  Substance Use Topics  . Smoking status: Former Smoker    Quit date: 07/19/1965  . Smokeless tobacco: Never Used  . Alcohol use 2.4 oz/week    4 Glasses of wine per week     Allergies   Bee venom; Food; Monosodium glutamate; Other; Peanut oil; and Gamma globulin [immune globulin, im]   Review of Systems Review of Systems  Constitutional: Negative for chills, diaphoresis, fatigue and fever.  HENT: Negative for congestion and rhinorrhea.   Respiratory: Negative for chest tightness, shortness of breath, wheezing and stridor.   Cardiovascular: Positive for near-syncope. Negative for chest pain and palpitations.  Gastrointestinal: Negative for abdominal  pain, constipation, diarrhea, nausea and vomiting.  Genitourinary: Negative for dysuria, flank pain and frequency.  Musculoskeletal: Negative for back pain, neck pain and neck stiffness.  Skin: Negative for rash and wound.  Neurological: Positive for speech difficulty (at baseline). Negative for seizures, syncope (naer), light-headedness, numbness and headaches.  Psychiatric/Behavioral: Negative for agitation.  All other systems reviewed and are negative.    Physical Exam Updated Vital Signs BP 139/62   Pulse (!) 59   Temp 97.3 F (36.3 C) (Oral)   Resp 16   SpO2 98%   Physical Exam  Constitutional: She appears well-developed and well-nourished. No distress.  HENT:  Head: Normocephalic and atraumatic.  Right Ear:  External ear normal.  Left Ear: External ear normal.  Nose: Nose normal.  Mouth/Throat: Oropharynx is clear and moist. No oropharyngeal exudate.  Eyes: Conjunctivae and EOM are normal. Pupils are equal, round, and reactive to light.  Neck: Normal range of motion. Neck supple.  Cardiovascular: Normal rate, regular rhythm, normal heart sounds and intact distal pulses.   No murmur heard. Pulmonary/Chest: Effort normal and breath sounds normal. No stridor. No respiratory distress. She has no wheezes. She has no rales. She exhibits no tenderness.  Abdominal: She exhibits no distension. There is no tenderness. There is no rebound.  Musculoskeletal: She exhibits no edema or tenderness.  Neurological: She is alert. She has normal reflexes. She displays no tremor and normal reflexes. No cranial nerve deficit or sensory deficit. She exhibits normal muscle tone. She displays no seizure activity. Coordination normal.  Pt only says yes or No  Skin: Skin is warm. Capillary refill takes less than 2 seconds. No rash noted. She is not diaphoretic. No erythema.  Psychiatric: She has a normal mood and affect.  Nursing note and vitals reviewed.    ED Treatments / Results  Labs (all labs ordered are listed, but only abnormal results are displayed) Labs Reviewed  BASIC METABOLIC PANEL - Abnormal; Notable for the following:       Result Value   Glucose, Bld 184 (*)    GFR calc non Af Amer 57 (*)    All other components within normal limits  URINALYSIS, ROUTINE W REFLEX MICROSCOPIC - Abnormal; Notable for the following:    APPearance HAZY (*)    Protein, ur 30 (*)    Nitrite POSITIVE (*)    Bacteria, UA RARE (*)    All other components within normal limits  URINE CULTURE  CBC  HEPATIC FUNCTION PANEL  PROTIME-INR  TSH  CBG MONITORING, ED    EKG  EKG Interpretation  Date/Time:  Saturday August 07 2016 21:22:05 EDT Ventricular Rate:  59 PR Interval:    QRS Duration: 94 QT Interval:  433 QTC  Calculation: 429 R Axis:   -11 Text Interpretation:  Sinus rhythm Left atrial enlargement Abnormal R-wave progression, early transition Left ventricular hypertrophy No prior ECG for comparison.  No STEMI.  Confirmed by Antony Blackbird [phone]) on 08/07/2016 9:38:54 PM       Radiology Dg Chest 2 View  Result Date: 08/07/2016 CLINICAL DATA:  Shaking episode after dinner. EXAM: CHEST  2 VIEW COMPARISON:  None. FINDINGS: Heart is top-normal in size. Minimal aortic atherosclerosis without aneurysm. Slight diffuse nonspecific interstitial prominence without pneumonic consolidation, effusion or pneumothorax. Findings may reflect age related chronic interstitial lung disease and/or fibrosis. Chronic bronchitic change might also account for some of this appearance. No acute nor suspicious osseous abnormalities. Degenerative changes are noted about both shoulders. IMPRESSION: Aortic atherosclerosis.  Mild interstitial prominence noted bilaterally which may reflect reflect chronic bronchitic change or interstitial disease. No pneumonic consolidation nor overt pulmonary edema. Electronically Signed   By: Ashley Royalty M.D.   On: 08/07/2016 22:48   Ct Head Wo Contrast  Result Date: 08/07/2016 CLINICAL DATA:  Dementia, shaking episode after dinner. EXAM: CT HEAD WITHOUT CONTRAST TECHNIQUE: Contiguous axial images were obtained from the base of the skull through the vertex without intravenous contrast. COMPARISON:  09/12/2014 MRI FINDINGS: BRAIN: There is sulcal and ventricular prominence consistent with superficial and central atrophy. No intraparenchymal hemorrhage, mass effect nor midline shift. Periventricular and subcortical white matter hypodensities consistent with chronic small vessel ischemic disease are identified. No acute large vascular territory infarcts. No abnormal extra-axial fluid collections. Basal cisterns are not effaced and midline. VASCULAR: Moderate calcific atherosclerosis of the carotid siphons.  SKULL: No skull fracture. No significant scalp soft tissue swelling. SINUSES/ORBITS: The mastoid air-cells are clear. The included paranasal sinuses are well-aerated.The included ocular globes and orbital contents are non-suspicious. OTHER: None. IMPRESSION: Chronic stable cerebral atrophy with moderate degree of small vessel ischemia. No acute intracranial abnormality. Electronically Signed   By: Ashley Royalty M.D.   On: 08/07/2016 22:46    Procedures Procedures (including critical care time)  Medications Ordered in ED Medications  cephALEXin (KEFLEX) capsule 500 mg (not administered)  sodium chloride 0.9 % bolus 1,000 mL (0 mLs Intravenous Stopped 08/07/16 2312)     Initial Impression / Assessment and Plan / ED Course  I have reviewed the triage vital signs and the nursing notes.  Pertinent labs & imaging results that were available during my care of the patient were reviewed by me and considered in my medical decision making (see chart for details).     Michelle Reeves is a 82 y.o. female with a past medical history significant for Alzheimer's, chronic aphasia,  thyroid disease, hypertension, and hyperlipidemia Who presents with a near syncopal episode. Patient is accompanied by family who reports that after a long dinner seated, patient stood up and took approximately 5 steps before getting faint. Patient got lightheaded and began to go to the ground. Patient was caught by family and laid down. Patient had several seconds of shaking but did not lose consciousness. She did not lose control of her bowel or bladder. She did not have any grabbing of her chest or have any other physical complaints. Patient laid down after several minutes began feeling better. Patient was diaphoretic after laying on the ground. Just prior to patient having her near-syncopal episode, patient said her daughter's name which she has not done in "a long time". Family was concerned about this change.  Patient transported to the  ED for further evaluation. Family says that the patient has not had any recent fevers, chills, rhinorrhea, congestion, cough, shortness of breath, change in urination, constipation, diarrhea, or any complaints of pain in her chest or abdomen or back. Husband reports the patient has had lightheaded spells in the past most recently in December. They think that the patient has been drinking relatively normally. They deny any other physical complaints.  History and exam are seen above. On exam, patient had clear lungs. Chest is nontender. Abdomen nontender. No significant lower extremity edema. Patient had normal finger-nose-finger and normal sensation in extremities. Patient is nonverbal aside from saying yes and no. Face was symmetric. Back and neck were nontender.  Given patient's episode, suspect a near syncopal spell secondary to dehydration or vasovagal tone. Patient will be  rehydrated with fluids while she gets lab testing to look for dehydration, kidney dysfunction, and imaging with urine to look for signs of occult infection. Due to the shaking spell and the patient's age with her a aphasia, patient will have CT scan of the head to look for significant abnormality.  If diagnostic testing is reassuring, suspect patient will be stable for discharge.  Diagnostic testing shows evidence of urinary tract infection with nitrite and neck area positivity. Other lab testing was reassuring. Hepatic function unremarkable. Normal INR. BMP unremarkable. CBC shows no leukocytosis or anemia. Chest x-ray shows no evidence of pneumonia or edema. CT head shows no acute changes.  Patient given fluids and reports feeling better. Patient will be treated with antibiotics for UTI. Given patient's tenuous well appearance and no further lightheadedness spells, patient will be discharged to follow-up with PCP. Patient's family had no other questions and patient was discharged in good condition.   Final Clinical  Impressions(s) / ED Diagnoses   Final diagnoses:  Near syncope  Acute cystitis without hematuria    New Prescriptions New Prescriptions   CEPHALEXIN (KEFLEX) 500 MG CAPSULE    Take 1 capsule (500 mg total) by mouth 2 (two) times daily.    Clinical Impression: 1. Near syncope   2. Acute cystitis without hematuria     Disposition: Discharge  Condition: Good  I have discussed the results, Dx and Tx plan with the pt(& family if present). He/she/they expressed understanding and agree(s) with the plan. Discharge instructions discussed at great length. Strict return precautions discussed and pt &/or family have verbalized understanding of the instructions. No further questions at time of discharge.    New Prescriptions   CEPHALEXIN (KEFLEX) 500 MG CAPSULE    Take 1 capsule (500 mg total) by mouth 2 (two) times daily.    Follow Up: Lawerance Cruel, MD [place] [address] [phone]  Schedule an appointment as soon as possible for a visit    [facility] [address] [id number] [place] [phone]  If symptoms worsen     Tegeler, Gwenyth Allegra, MD 08/08/16 [id number]

## 2016-08-07 NOTE — ED Triage Notes (Signed)
Pt brought in via EMS. Was leaving dinner when the family states she became shaky and was assisted to a sitting position. Pt has hx of dementia and baseline of follows commands and answers yes and no. Pt NAD and smiling during triage

## 2016-08-08 DIAGNOSIS — R55 Syncope and collapse: Secondary | ICD-10-CM | POA: Diagnosis not present

## 2016-08-08 LAB — URINALYSIS, ROUTINE W REFLEX MICROSCOPIC
BILIRUBIN URINE: NEGATIVE
GLUCOSE, UA: NEGATIVE mg/dL
Hgb urine dipstick: NEGATIVE
KETONES UR: NEGATIVE mg/dL
LEUKOCYTES UA: NEGATIVE
NITRITE: POSITIVE — AB
PH: 5 (ref 5.0–8.0)
PROTEIN: 30 mg/dL — AB
Specific Gravity, Urine: 1.017 (ref 1.005–1.030)
Squamous Epithelial / LPF: NONE SEEN

## 2016-08-08 LAB — TSH: TSH: 2.612 u[IU]/mL (ref 0.350–4.500)

## 2016-08-08 MED ORDER — CEPHALEXIN 250 MG PO CAPS
500.0000 mg | ORAL_CAPSULE | Freq: Once | ORAL | Status: AC
  Administered 2016-08-08: 500 mg via ORAL
  Filled 2016-08-08: qty 2

## 2016-08-08 MED ORDER — CEPHALEXIN 500 MG PO CAPS: 500.0000 mg | ORAL_CAPSULE | Freq: Two times a day (BID) | ORAL | 0 refills | Status: AC

## 2016-08-08 NOTE — Discharge Instructions (Signed)
Please take the antibiotics to treat the urinary tract infection that we found. Please stay hydrated. Please be careful when standing up quickly. Please follow-up with her PCP in the next several days for reassessment. If any symptoms change or worsen, please return to the nearest emergency department.

## 2016-08-08 NOTE — ED Notes (Signed)
Pt understood dc material. Scripts given at dc. NAD noted

## 2016-08-10 LAB — URINE CULTURE: Culture: >=100000 — AB

## 2016-08-11 ENCOUNTER — Telehealth: Payer: Self-pay | Admitting: Emergency Medicine

## 2016-08-11 NOTE — Telephone Encounter (Signed)
Post ED Visit - Positive Culture Follow-up  Culture report reviewed by antimicrobial stewardship pharmacist:  [x]  Elenor Quinones, Pharm.D. []  Heide Guile, Pharm.D., BCPS AQ-ID []  Parks Neptune, Pharm.D., BCPS []  Alycia Rossetti, Pharm.D., BCPS []  [place], Pharm.D., BCPS, AAHIVP []  Legrand Como, Pharm.D., BCPS, AAHIVP []  Salome Arnt, PharmD, BCPS []  Dimitri Ped, PharmD, BCPS []  Vincenza Hews, PharmD, BCPS  Positive urine culture Treated with cephalexin, organism sensitive to the same and no further patient follow-up is required at this time.  [name] 08/11/2016, 2:08 PM

## 2016-08-16 ENCOUNTER — Encounter: Payer: Self-pay | Admitting: Neurology

## 2016-08-16 ENCOUNTER — Other Ambulatory Visit: Payer: Self-pay | Admitting: Neurology

## 2016-08-16 ENCOUNTER — Ambulatory Visit (INDEPENDENT_AMBULATORY_CARE_PROVIDER_SITE_OTHER): Payer: Medicare Other | Admitting: Neurology

## 2016-08-16 VITALS — BP 140/80 | HR 66 | Ht 63.0 in | Wt 146.2 lb

## 2016-08-16 DIAGNOSIS — F0280 Dementia in other diseases classified elsewhere without behavioral disturbance: Secondary | ICD-10-CM

## 2016-08-16 DIAGNOSIS — G301 Alzheimer's disease with late onset: Secondary | ICD-10-CM

## 2016-08-16 NOTE — Progress Notes (Signed)
NEUROLOGY FOLLOW UP OFFICE NOTE  Michelle Reeves [id number]  HISTORY OF PRESENT ILLNESS: Michelle Reeves is an 82 year old right-handed woman with hypothyroidism and hypertension who follows up for memory loss, probable Alzheimer's disease.  She is accompanied by her husband who provides history.     UPDATE:  She is currently taking Aricept and Namenda.  She is able to perform ADLs independently, including dressing, bathing and using toilet.  However, sometimes she needs assistance with dressing.  She reportedly is not incontinent.  Her husband manages and monitors her medications.  She does not exhibit episodes of confusion or agitation.  She is docile.  She does not hallucinate.  She had not had problems recognizing friends and family.  She and her husband have little conversation.  She no longer drives.  She goes for walks daily around the neighborhood by herself.  She walks daily.  5 days a week, she has a caregiver who accompanies her. On the other days, she carries a cell phone in case she needs to call her husband or if her husband needs to contact her.  She and her husband joined the pool. Last visit, they were given information for PT/OT/Speech therapy but didn't contact them.  She was seen in the ED on 08/07/16 for syncope.  CT of head was personally reviewed and revealed no acute abnormality.  She was found to have a UTI.  She was treated with IVF and prescribed antibiotic.   HISTORY: She has had a gradual progression of memory problems since 2013-2014.  At first, she would misplace objects.  She eventually started having trouble quickly recalling names of people she knows.  She began having trouble correctly paying the bills.  She has become more inattentive and has problems with calculations.  She is more withdrawn and doesn't converse as much anymore.  She reports difficulty getting certain words out but denies word-finding problems.  She sleeps well.  She denies depression.  She denies  hallucinations or delusions.  She has not had episodes of confusion or agitation.  She no longer drives.  MRI of brain with and without contrast from 09/12/14 showed moderate generalized atrophy and chronic small vessel ischemic changes.     She has no family history of dementia.  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  PAST MEDICAL HISTORY: Past Medical History:  Diagnosis Date  . Hyperlipidemia   . Hypertension   . Thyroid disease     MEDICATIONS: Current Outpatient Prescriptions on File Prior to Visit  Medication Sig Dispense Refill  . aspirin 81 MG tablet Take 81 mg by mouth daily.      Marland Kitchen donepezil (ARICEPT) 10 MG tablet Take 1 tablet (10 mg total) by mouth at bedtime. 90 tablet 0  . EPINEPHrine 0.3 mg/0.3 mL IJ SOAJ injection Inject into the muscle once.    Marland Kitchen levothyroxine (SYNTHROID, LEVOTHROID) 75 MCG tablet Take 75 mcg by mouth daily.      Marland Kitchen losartan (COZAAR) 50 MG tablet Take 50 mg by mouth daily.      . memantine (NAMENDA) 10 MG tablet Take 1 tablet (10 mg total) by mouth 2 (two) times daily. 180 tablet 0  . Memantine HCl-Donepezil HCl (NAMZARIC) 28-10 MG CP24 Take 1 capsule by mouth daily. 30 capsule 8  . Multiple Vitamin (DAILY VITE PO) Take 1 tablet by mouth daily.      . simvastatin (ZOCOR) 80 MG tablet Take 80 mg by mouth daily.  No current facility-administered medications on file prior to visit.     ALLERGIES: Allergies  Allergen Reactions  . Bee Venom Swelling    Yellow jackets and wasps  . Food Swelling    Cinnamon,Eggplant,Ginger,Honey,MSG,Mushrooms,Peanuts,Peanut oil,Raisins,Soy Sauce,Sesame Oil,Sesame seeds,Squash,Strawberries, Thyme,canned Tuna,& Worcestershire Sauce.  . Monosodium Glutamate Swelling  . Other Swelling  . Peanut Oil     Other reaction(s): Unknown  . Gamma Globulin [Immune Globulin, Im] Other (See Comments)    Premature birth    FAMILY HISTORY: Family History  Problem Relation Age of Onset  . Stomach cancer Mother    . Heart failure Brother   . Cancer Paternal Grandfather        stomach    SOCIAL HISTORY: Social History   Social History  . Marital status: Unknown    Spouse name: N/A  . Number of children: N/A  . Years of education: N/A   Occupational History  . Not on file.   Social History Main Topics  . Smoking status: Former Smoker    Quit date: 07/19/1965  . Smokeless tobacco: Never Used  . Alcohol use 2.4 oz/week    4 Glasses of wine per week  . Drug use: No  . Sexual activity: Yes    Partners: Male   Other Topics Concern  . Not on file   Social History Narrative  . No narrative on file    REVIEW OF SYSTEMS: Constitutional: No fevers, chills, or sweats, no generalized fatigue, change in appetite Eyes: No visual changes, double vision, eye pain Ear, nose and throat: No hearing loss, ear pain, nasal congestion, sore throat Cardiovascular: No chest pain, palpitations Respiratory:  No shortness of breath at rest or with exertion, wheezes GastrointestinaI: No nausea, vomiting, diarrhea, abdominal pain, fecal incontinence Genitourinary:  No dysuria, urinary retention or frequency Musculoskeletal:  No neck pain, back pain Integumentary: No rash, pruritus, skin lesions Neurological: as above Psychiatric: No depression, insomnia, anxiety Endocrine: No palpitations, fatigue, diaphoresis, mood swings, change in appetite, change in weight, increased thirst Hematologic/Lymphatic:  No purpura, petechiae. Allergic/Immunologic: no itchy/runny eyes, nasal congestion, recent allergic reactions, rashes  PHYSICAL EXAM: Vitals:   08/16/16 1117  BP: 140/80  Pulse: 66   General: No acute distress.  Patient appears well-groomed.  normal body habitus. Head:  Normocephalic/atraumatic Eyes:  Fundi examined but not visualized Neck: supple, no paraspinal tenderness, full range of motion Heart:  Regular rate and rhythm Lungs:  Clear to auscultation bilaterally Back: No paraspinal  tenderness Neurological Exam: alert and oriented to person, state, county and floor, but not town or time. Attention span and concentration poor, recent memory poor, remote memory fair, fund of knowledge reduced.  Decreased verbal output but peech fluent and not dysarthric, able to name, read, and write but not repeat.  MMSE - Mini Mental State Exam 08/16/2016 12/10/2015 03/12/2015  Orientation to time 0 1 4  Orientation to Place 3 3 4   Registration 2 3 3   Attention/ Calculation 0 2 2  Recall 0 0 3  Language- name 2 objects 2 2 2   Language- repeat 0 1 1  Language- follow 3 step command 3 2 3   Language- read & follow direction 1 1 1   Write a sentence 1 1 0  Copy design 0 0 0  Total score 12 16 23    Oculomotor apraxia.  Otherwise, CN II-XII intact. Bulk and tone normal, muscle strength 5/5 throughout.  Sensation to light touch, temperature and vibration intact.  Deep tendon reflexes 2+ throughout, toes  downgoing.  Finger to nose and heel to shin testing intact.  Gait normal, Romberg negative.  IMPRESSION: Alzheimer's dementia  PLAN: Aricept and Namenda She will continue daily physical activity (walking and pool) Her husband will consider PT/OT/Speech therapy Follow up in 9 months or as needed.  25 minutes spent face to face with patient, over 50% spent discussing management.  Metta Clines, DO  CC: C. Melinda Crutch, MD

## 2016-08-16 NOTE — Patient Instructions (Signed)
1.  Continue donepezil and memantine daily 2.  Follow up in 9 months.

## 2016-08-20 DIAGNOSIS — I10 Essential (primary) hypertension: Secondary | ICD-10-CM | POA: Diagnosis not present

## 2016-08-20 DIAGNOSIS — R399 Unspecified symptoms and signs involving the genitourinary system: Secondary | ICD-10-CM | POA: Diagnosis not present

## 2016-09-08 ENCOUNTER — Ambulatory Visit: Payer: Medicare Other | Admitting: Neurology

## 2016-09-14 DIAGNOSIS — L97511 Non-pressure chronic ulcer of other part of right foot limited to breakdown of skin: Secondary | ICD-10-CM | POA: Diagnosis not present

## 2016-09-14 DIAGNOSIS — L97521 Non-pressure chronic ulcer of other part of left foot limited to breakdown of skin: Secondary | ICD-10-CM | POA: Diagnosis not present

## 2016-09-14 DIAGNOSIS — L603 Nail dystrophy: Secondary | ICD-10-CM | POA: Diagnosis not present

## 2016-09-14 DIAGNOSIS — L84 Corns and callosities: Secondary | ICD-10-CM | POA: Diagnosis not present

## 2016-09-14 DIAGNOSIS — I739 Peripheral vascular disease, unspecified: Secondary | ICD-10-CM | POA: Diagnosis not present

## 2016-09-28 DIAGNOSIS — R399 Unspecified symptoms and signs involving the genitourinary system: Secondary | ICD-10-CM | POA: Diagnosis not present

## 2016-09-28 DIAGNOSIS — E039 Hypothyroidism, unspecified: Secondary | ICD-10-CM | POA: Diagnosis not present

## 2016-09-28 DIAGNOSIS — E7800 Pure hypercholesterolemia, unspecified: Secondary | ICD-10-CM | POA: Diagnosis not present

## 2016-09-28 DIAGNOSIS — Z0000 Encounter for general adult medical examination without abnormal findings: Secondary | ICD-10-CM | POA: Diagnosis not present

## 2016-09-28 DIAGNOSIS — I10 Essential (primary) hypertension: Secondary | ICD-10-CM | POA: Diagnosis not present

## 2016-10-01 DIAGNOSIS — I10 Essential (primary) hypertension: Secondary | ICD-10-CM | POA: Diagnosis not present

## 2016-10-01 DIAGNOSIS — R399 Unspecified symptoms and signs involving the genitourinary system: Secondary | ICD-10-CM | POA: Diagnosis not present

## 2016-10-01 DIAGNOSIS — E039 Hypothyroidism, unspecified: Secondary | ICD-10-CM | POA: Diagnosis not present

## 2016-10-01 DIAGNOSIS — Z0000 Encounter for general adult medical examination without abnormal findings: Secondary | ICD-10-CM | POA: Diagnosis not present

## 2016-10-01 DIAGNOSIS — E7800 Pure hypercholesterolemia, unspecified: Secondary | ICD-10-CM | POA: Diagnosis not present

## 2016-11-17 ENCOUNTER — Other Ambulatory Visit: Payer: Self-pay | Admitting: Neurology

## 2016-12-03 DIAGNOSIS — J3089 Other allergic rhinitis: Secondary | ICD-10-CM | POA: Diagnosis not present

## 2016-12-03 DIAGNOSIS — J3081 Allergic rhinitis due to animal (cat) (dog) hair and dander: Secondary | ICD-10-CM | POA: Diagnosis not present

## 2016-12-03 DIAGNOSIS — T63451D Toxic effect of venom of hornets, accidental (unintentional), subsequent encounter: Secondary | ICD-10-CM | POA: Diagnosis not present

## 2016-12-03 DIAGNOSIS — J301 Allergic rhinitis due to pollen: Secondary | ICD-10-CM | POA: Diagnosis not present

## 2016-12-28 DIAGNOSIS — I739 Peripheral vascular disease, unspecified: Secondary | ICD-10-CM | POA: Diagnosis not present

## 2016-12-28 DIAGNOSIS — L603 Nail dystrophy: Secondary | ICD-10-CM | POA: Diagnosis not present

## 2016-12-28 DIAGNOSIS — L84 Corns and callosities: Secondary | ICD-10-CM | POA: Diagnosis not present

## 2017-02-11 ENCOUNTER — Other Ambulatory Visit: Payer: Self-pay | Admitting: Neurology

## 2017-03-06 IMAGING — MR MR HEAD WO/W CM
11 of 14 series · 28 of 48 positions shown · IV contrast (cc)
Comparison: None.

CLINICAL DATA: Progressive memory loss over the last 3 years.
Episodes of disorientation.

EXAM:
MRI HEAD WITHOUT AND WITH CONTRAST
TECHNIQUE: Multiplanar, multiecho pulse sequences of the brain and surrounding
structures were obtained without and with intravenous contrast.
CONTRAST:  13mL MULTIHANCE GADOBENATE DIMEGLUMINE 529 MG/ML IV SOLN

[Series 3: DWI · axial · 3.0mm · 0.94mm/px · z∈[-70,+64]mm · 4 of 94 slices shown (1 of 4)]
[im 1/94]
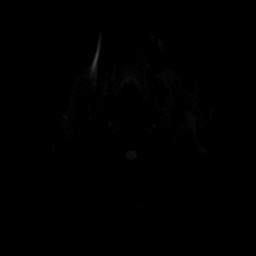
[im 32/94]
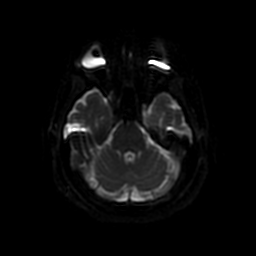
[im 63/94]
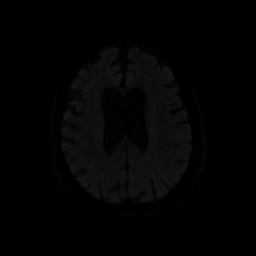
[im 94/94]
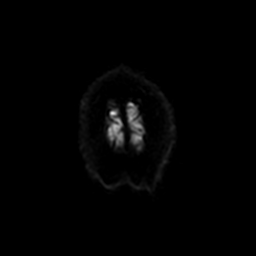

[Series 4: DWI · coronal · 5.0mm · 0.94mm/px · 4 of 66 slices shown (2 of 4)]
[im 1/66]
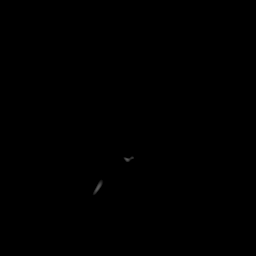
[im 22/66]
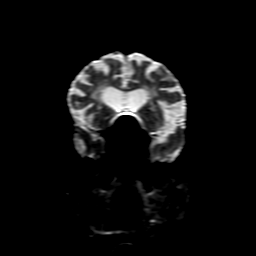
[im 44/66]
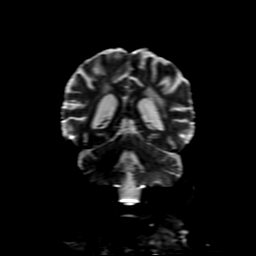
[im 66/66]
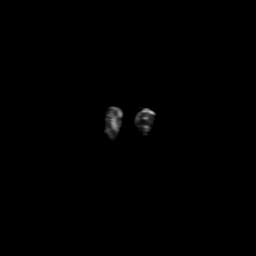

[Series 5: FLAIR · sagittal · 5.0mm · 0.47mm/px · 1 of 23 slices shown (1 of 2)]
[im 1/23]
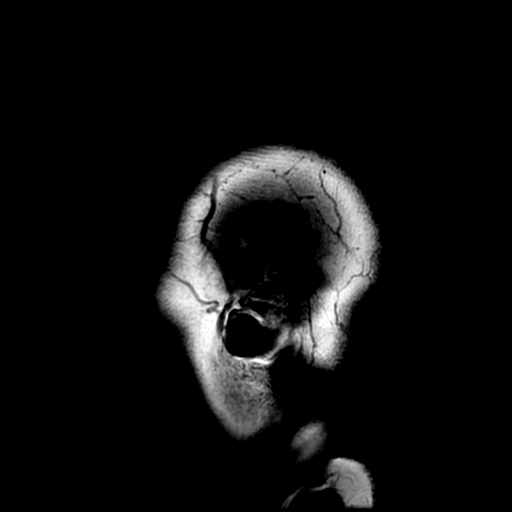

[Series 6: FLAIR · axial · 5.0mm · 0.47mm/px · 1 of 24 slices shown (2 of 2)]
[im 1/24]
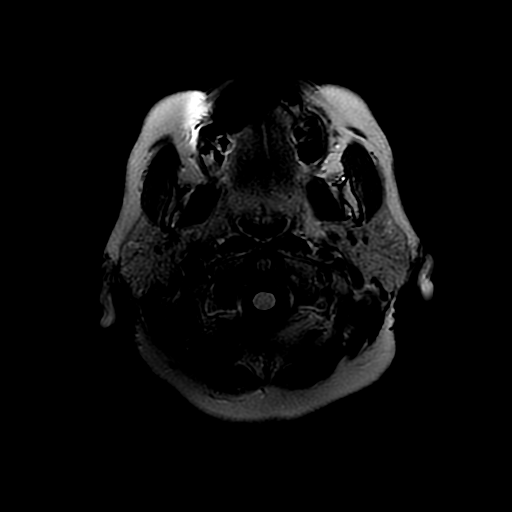

[Series 9: T2 · axial · 5.0mm · 0.47mm/px · 1 of 25 slices shown]
[im 1/25]
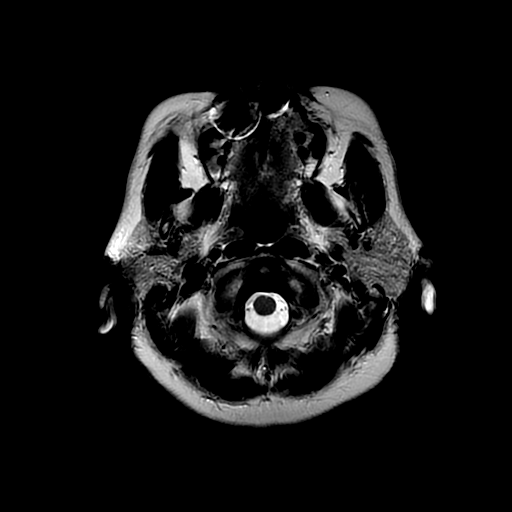

[Series 10: (person_name) · axial · 2.0mm · 0.47mm/px · z∈[-65,+46]mm · 7 of 136 slices shown]
[im 1/136]
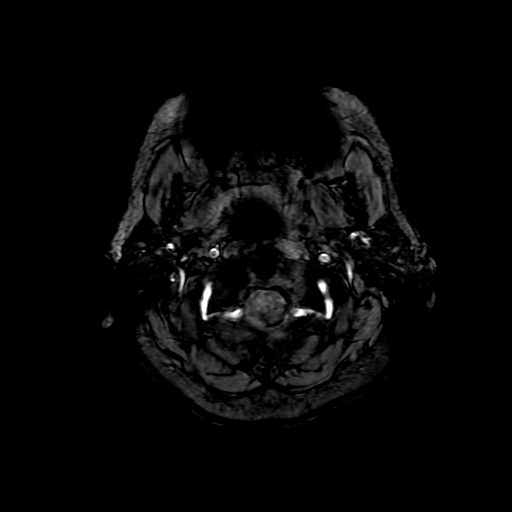
[im 20/136]
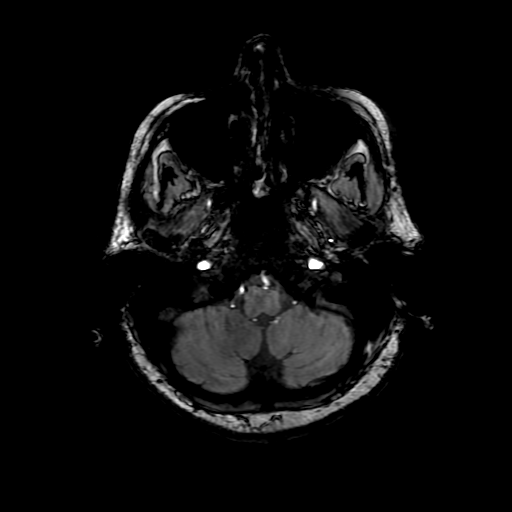
[im 39/136]
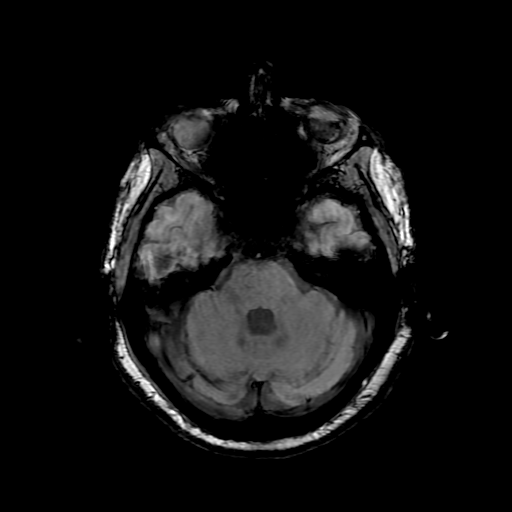
[im 58/136]
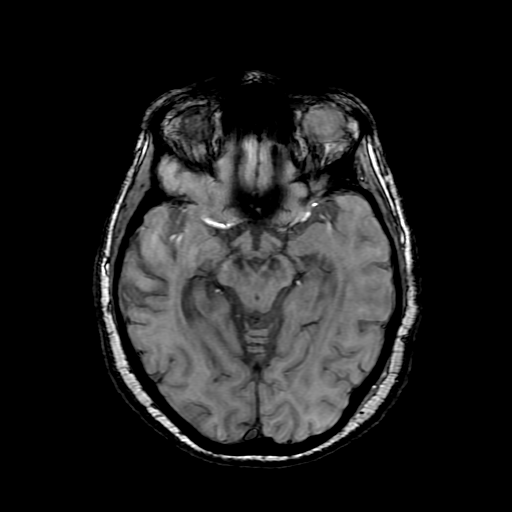
[im 78/136]
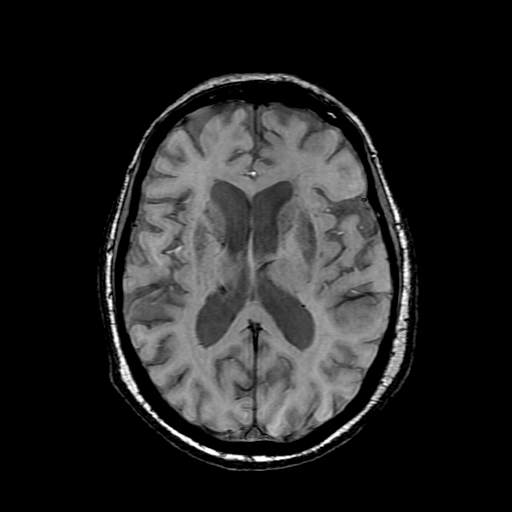
[im 97/136]
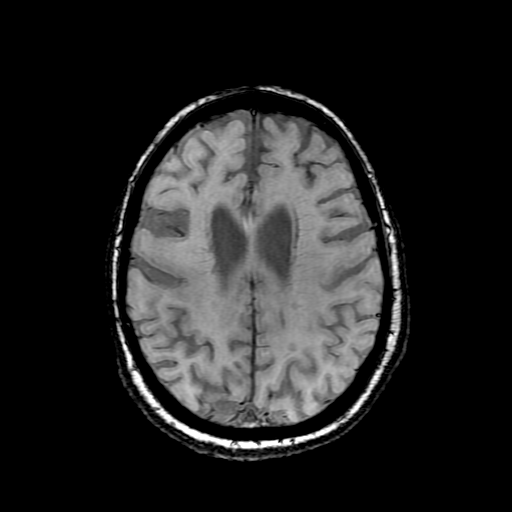
[im 116/136]
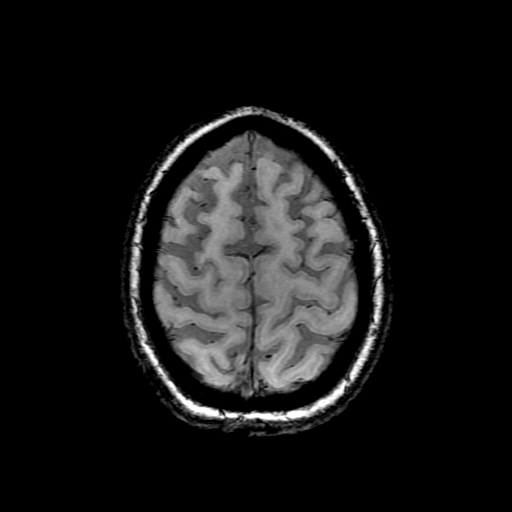

[Series 11: T2 post-contrast · coronal · 5.0mm · 0.39mm/px · 2 of 28 slices shown]
[im 1/28]
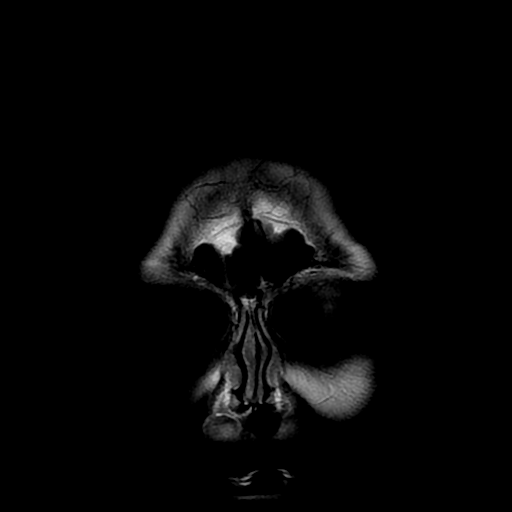
[im 28/28]
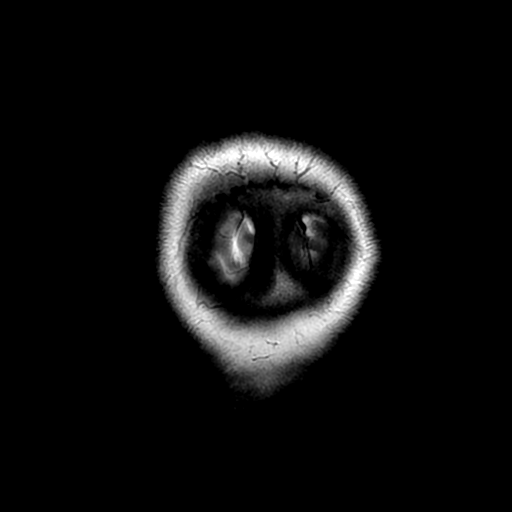

[Series 12: T1 · axial · 5.0mm · 0.47mm/px · 1 of 24 slices shown (1 of 2)]
[im 1/24]
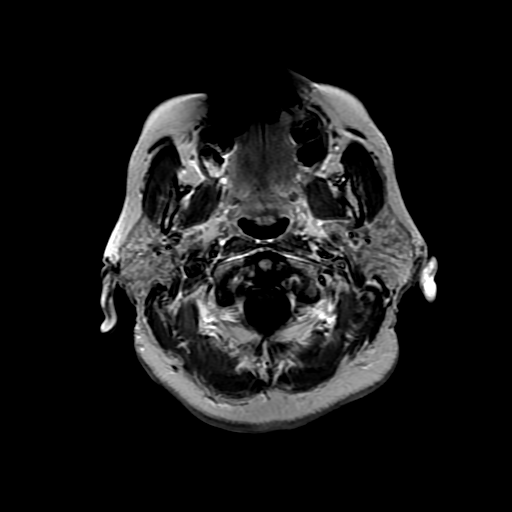

[Series 13: T1 · coronal · 5.0mm · 0.39mm/px · 2 of 28 slices shown (2 of 2)]
[im 1/28]
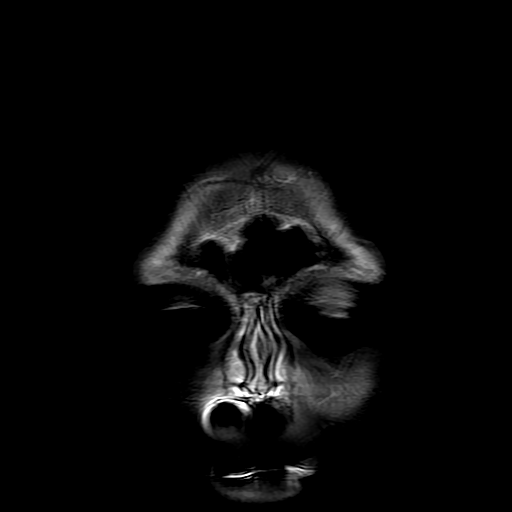
[im 28/28]
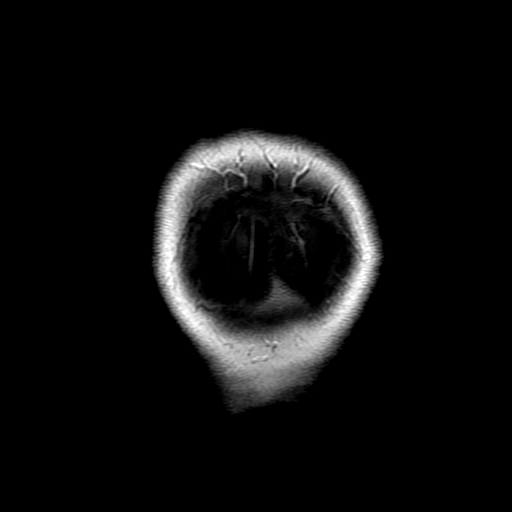

[Series 300: DWI · axial · 3.0mm · 0.94mm/px · z∈[-70,+64]mm · 3 of 47 slices shown (3 of 4)]
[im 1/47]
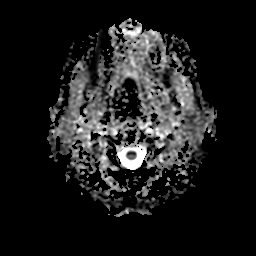
[im 24/47]
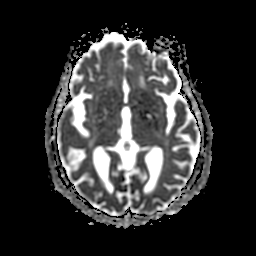
[im 47/47]
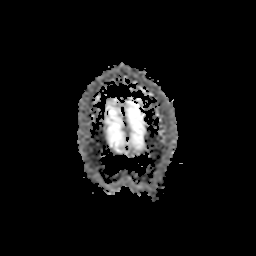

[Series 400: DWI · coronal · 5.0mm · 0.94mm/px · 2 of 32 slices shown (4 of 4)]
[im 1/32]
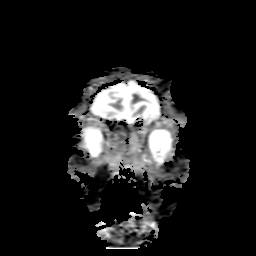
[im 32/32]
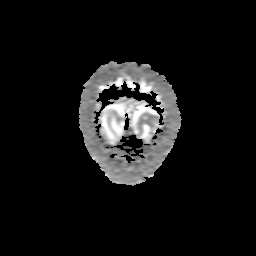

[28 of 48 positions shown; findings below may reference images not displayed]

FINDINGS: Moderate generalized atrophy and diffuse white matter T2
hyperintensity is noted bilaterally. The pattern of atrophy is
nonspecific.

No acute infarct, hemorrhage, or mass lesion is present. A remote
lacunar infarct is noted within the right thalamus. Dilated
perivascular spaces are present within the basal ganglia
bilaterally.

No acute hemorrhage or mass lesion is present. Flow is present in
the major intracranial arteries. The globes and orbits are intact.
The paranasal sinuses and right mastoid air cells are clear. Minimal
fluid is present in the left mastoid air cells. No obstructing
nasopharyngeal lesion is present.

The postcontrast images demonstrate no pathologic enhancement.
IMPRESSION: 1. Moderate generalized atrophy and diffuse white matter disease.
This is nonspecific, but likely reflects the sequela of chronic
microvascular ischemia.
2. No acute intracranial abnormality.
3. Remote lacunar infarct of the right thalamus.
4. Minimal fluid in left mastoid air cells. No obstructing
nasopharyngeal lesion is evident.

## 2017-03-21 ENCOUNTER — Encounter (HOSPITAL_BASED_OUTPATIENT_CLINIC_OR_DEPARTMENT_OTHER): Payer: Self-pay | Admitting: *Deleted

## 2017-03-21 ENCOUNTER — Emergency Department (HOSPITAL_BASED_OUTPATIENT_CLINIC_OR_DEPARTMENT_OTHER)
Admission: EM | Admit: 2017-03-21 | Discharge: 2017-03-21 | Disposition: A | Discharge status: Home / Self Care | Payer: Medicare Other | Attending: Emergency Medicine | Admitting: Emergency Medicine

## 2017-03-21 ENCOUNTER — Other Ambulatory Visit: Payer: Self-pay

## 2017-03-21 DIAGNOSIS — R2689 Other abnormalities of gait and mobility: Secondary | ICD-10-CM | POA: Diagnosis present

## 2017-03-21 DIAGNOSIS — R42 Dizziness and giddiness: Secondary | ICD-10-CM | POA: Diagnosis not present

## 2017-03-21 DIAGNOSIS — Z7982 Long term (current) use of aspirin: Secondary | ICD-10-CM | POA: Insufficient documentation

## 2017-03-21 DIAGNOSIS — Z91010 Allergy to peanuts: Secondary | ICD-10-CM | POA: Diagnosis not present

## 2017-03-21 DIAGNOSIS — Z79899 Other long term (current) drug therapy: Secondary | ICD-10-CM | POA: Diagnosis not present

## 2017-03-21 DIAGNOSIS — N390 Urinary tract infection, site not specified: Secondary | ICD-10-CM | POA: Diagnosis not present

## 2017-03-21 DIAGNOSIS — Z87891 Personal history of nicotine dependence: Secondary | ICD-10-CM | POA: Diagnosis not present

## 2017-03-21 DIAGNOSIS — F0390 Unspecified dementia without behavioral disturbance: Secondary | ICD-10-CM | POA: Insufficient documentation

## 2017-03-21 DIAGNOSIS — G309 Alzheimer's disease, unspecified: Secondary | ICD-10-CM | POA: Insufficient documentation

## 2017-03-21 DIAGNOSIS — E079 Disorder of thyroid, unspecified: Secondary | ICD-10-CM | POA: Diagnosis not present

## 2017-03-21 DIAGNOSIS — I10 Essential (primary) hypertension: Secondary | ICD-10-CM | POA: Diagnosis not present

## 2017-03-21 HISTORY — DX: Raynaud's syndrome without gangrene: I73.00

## 2017-03-21 HISTORY — DX: Unspecified dementia, unspecified severity, without behavioral disturbance, psychotic disturbance, mood disturbance, and anxiety: F03.90

## 2017-03-21 LAB — CBC WITH DIFFERENTIAL/PLATELET
Basophils Absolute: 0 10*3/uL (ref 0.0–0.1)
Basophils Relative: 0 %
EOS PCT: 0 %
Eosinophils Absolute: 0.1 10*3/uL (ref 0.0–0.7)
HCT: 42.2 % (ref 36.0–46.0)
Hemoglobin: 14.3 g/dL (ref 12.0–15.0)
LYMPHS ABS: 1.2 10*3/uL (ref 0.7–4.0)
LYMPHS PCT: 8 %
MCH: 30.2 pg (ref 26.0–34.0)
MCHC: 33.9 g/dL (ref 30.0–36.0)
MCV: 89.2 fL (ref 78.0–100.0)
MONO ABS: 0.8 10*3/uL (ref 0.1–1.0)
MONOS PCT: 5 %
Neutro Abs: 13.2 10*3/uL — ABNORMAL HIGH (ref 1.7–7.7)
Neutrophils Relative %: 87 %
PLATELETS: 237 10*3/uL (ref 150–400)
RBC: 4.73 MIL/uL (ref 3.87–5.11)
RDW: 13.1 % (ref 11.5–15.5)
WBC: 15.3 10*3/uL — AB (ref 4.0–10.5)

## 2017-03-21 LAB — URINALYSIS, MICROSCOPIC (REFLEX)

## 2017-03-21 LAB — URINALYSIS, ROUTINE W REFLEX MICROSCOPIC
Bilirubin Urine: NEGATIVE
GLUCOSE, UA: NEGATIVE mg/dL
Hgb urine dipstick: NEGATIVE
KETONES UR: NEGATIVE mg/dL
Leukocytes, UA: NEGATIVE
NITRITE: POSITIVE — AB
PROTEIN: 100 mg/dL — AB
Specific Gravity, Urine: >1.03 — ABNORMAL HIGH (ref 1.005–1.030)
pH: 6 (ref 5.0–8.0)

## 2017-03-21 LAB — COMPREHENSIVE METABOLIC PANEL
ALT: 26 U/L (ref 14–54)
AST: 25 U/L (ref 15–41)
Albumin: 4.5 g/dL (ref 3.5–5.0)
Alkaline Phosphatase: 56 U/L (ref 38–126)
Anion gap: 9 (ref 5–15)
BILIRUBIN TOTAL: 0.4 mg/dL (ref 0.3–1.2)
BUN: 26 mg/dL — AB (ref 6–20)
CO2: 23 mmol/L (ref 22–32)
Calcium: 10.2 mg/dL (ref 8.9–10.3)
Chloride: 105 mmol/L (ref 101–111)
Creatinine, Ser: 0.96 mg/dL (ref 0.44–1.00)
GFR, EST NON AFRICAN AMERICAN: 54 mL/min — AB (ref >60)
Glucose, Bld: 102 mg/dL — ABNORMAL HIGH (ref 65–99)
POTASSIUM: 4.1 mmol/L (ref 3.5–5.1)
Sodium: 137 mmol/L (ref 135–145)
TOTAL PROTEIN: 8.6 g/dL — AB (ref 6.5–8.1)

## 2017-03-21 LAB — I-STAT CG4 LACTIC ACID, ED: Lactic Acid, Venous: 1.02 mmol/L (ref 0.5–1.9)

## 2017-03-21 MED ORDER — DEXTROSE 5 % IV SOLN
1.0000 g | Freq: Once | INTRAVENOUS | Status: AC
  Administered 2017-03-21: 1 g via INTRAVENOUS
  Filled 2017-03-21: qty 10

## 2017-03-21 MED ORDER — CEPHALEXIN 500 MG PO CAPS: 500.0000 mg | ORAL_CAPSULE | Freq: Two times a day (BID) | ORAL | 0 refills | Status: AC

## 2017-03-21 NOTE — ED Triage Notes (Signed)
Husband states sudden onset of dizziness x 1 day, sent here from PMD office for eval

## 2017-03-21 NOTE — ED Provider Notes (Signed)
[facility]PART[facility] Provider Note   CSN: [id number] Arrival date & time: 03/21/17  1353     History   Chief Complaint Chief Complaint  Patient presents with  . Dizziness    HPI Michelle Reeves is a 83 y.o. female.  83yo F w/ PMH including dementia, HTN, HLD, Raynaud's disease who p/w balance problems. This morning after breakfast she went walking and after about 30 minutes of walking, her caregiver noted that she seemed off balance. Husband was not present when this episode occurred but they could not get her temperature to register so PCP told them to go to the ED. She has been at her baseline here in the ED. He reports she had a similar episode in May 2018 and was ultimately diagnosed w/ UTI. She has not had problems with UTI since then. No fevers, vomiting, recent change in behavior, or change in appetite. She has had no recent changes to medications, new medications, or antibiotics. Of note, she was wearing warm clothes but it was 20 degrees this morning.  LEVEL 5 CAVEAT DUE TO DEMENTIA   The history is provided by the spouse. The history is limited by the condition of the patient.  Dizziness    Past Medical History:  Diagnosis Date  . Dementia   . Hyperlipidemia   . Hypertension   . Raynaud disease   . Thyroid disease     Patient Active Problem List   Diagnosis Date Noted  . Alzheimer's disease 03/12/2015  . Essential hypertension 09/05/2014  . Left knee pain 11/14/2013  . Hammertoe 11/14/2013  . SACROILIITIS 07/21/2006    Past Surgical History:  Procedure Laterality Date  . APPENDECTOMY    . COLONOSCOPY  12 years ago   done by Dr.Kaplan    OB History    No data available       Home Medications    Prior to Admission medications   Medication Sig Start Date End Date Taking? Authorizing Provider  aspirin 81 MG tablet Take 81 mg by mouth daily.      [provider]  cephALEXin (KEFLEX) 500 MG capsule Take 1 capsule (500 mg  total) by mouth 2 (two) times daily for 7 days. 03/21/17 03/28/17  Little, Wenda Overland, MD  donepezil (ARICEPT) 10 MG tablet TAKE ONE TABLET BY MOUTH AT BEDTIME  02/11/17   Jaffe, Adam R, DO  EPINEPHrine 0.3 mg/0.3 mL IJ SOAJ injection Inject into the muscle once.    [provider]  levothyroxine (SYNTHROID, LEVOTHROID) 75 MCG tablet Take 75 mcg by mouth daily.      [provider]  losartan (COZAAR) 50 MG tablet Take 50 mg by mouth daily.      [provider]  memantine (NAMENDA) 10 MG tablet TAKE ONE TABLET BY MOUTH TWICE DAILY  02/11/17   Pieter Partridge, DO  Memantine HCl-Donepezil HCl (NAMZARIC) 28-10 MG CP24 Take 1 capsule by mouth daily. 12/10/15   Pieter Partridge, DO  Multiple Vitamin (DAILY VITE PO) Take 1 tablet by mouth daily.      [provider]  simvastatin (ZOCOR) 80 MG tablet Take 80 mg by mouth daily.      [provider]    Family History Family History  Problem Relation Age of Onset  . Stomach cancer Mother   . Heart failure Brother   . Cancer Paternal Grandfather        stomach    Social History Social History  Tobacco Use  . Smoking status: Former Smoker    Last attempt to quit: 07/19/1965    Years since quitting: 51.7  . Smokeless tobacco: Never Used  Substance Use Topics  . Alcohol use: Yes    Alcohol/week: 2.4 oz    Types: 4 Glasses of wine per week  . Drug use: No     Allergies   Bee venom; Food; Monosodium glutamate; Other; Peanut oil; and Gamma globulin [immune globulin, im]   Review of Systems Review of Systems  Unable to perform ROS: Dementia  Neurological: Positive for dizziness.     Physical Exam Updated Vital Signs BP 139/64 (BP Location: Right Arm)   Pulse (!) 59   Temp (!) 97.5 F (36.4 C) (Oral)   Resp 14   Ht 5\' 2"  (1.575 m)   Wt 65.8 kg (145 lb)   SpO2 99%   BMI 26.52 kg/m   Physical Exam  Constitutional: She appears well-developed and well-nourished. No distress.  HENT:    Head: Normocephalic and atraumatic.  Moist mucous membranes  Eyes: Conjunctivae are normal. Pupils are equal, round, and reactive to light.  Neck: Neck supple.  Cardiovascular: Normal rate, regular rhythm and normal heart sounds.  No murmur heard. Pulmonary/Chest: Effort normal and breath sounds normal.  Abdominal: Soft. Bowel sounds are normal. She exhibits no distension. There is no tenderness.  Musculoskeletal: She exhibits no edema.  Neurological: She is alert.  Non-verbal, smiles in response to voice  Skin: Skin is warm and dry.  Birth mark R forearm  Nursing note and vitals reviewed.    ED Treatments / Results  Labs (all labs ordered are listed, but only abnormal results are displayed) Labs Reviewed  URINALYSIS, ROUTINE W REFLEX MICROSCOPIC - Abnormal; Notable for the following components:      Result Value   APPearance CLOUDY (*)    Specific Gravity, Urine >1.030 (*)    Protein, ur 100 (*)    Nitrite POSITIVE (*)    All other components within normal limits  URINALYSIS, MICROSCOPIC (REFLEX) - Abnormal; Notable for the following components:   Bacteria, UA MANY (*)    Squamous Epithelial / LPF 0-5 (*)    All other components within normal limits  COMPREHENSIVE METABOLIC PANEL - Abnormal; Notable for the following components:   Glucose, Bld 102 (*)    BUN 26 (*)    Total Protein 8.6 (*)    GFR calc non Af Amer 54 (*)    All other components within normal limits  CBC WITH DIFFERENTIAL/PLATELET - Abnormal; Notable for the following components:   WBC 15.3 (*)    Neutro Abs 13.2 (*)    All other components within normal limits  URINE CULTURE  I-STAT CG4 LACTIC ACID, ED  I-STAT CG4 LACTIC ACID, ED    EKG  EKG Interpretation  Date/Time:  Monday March 21 2017 14:00:07 EST Ventricular Rate:  62 PR Interval:  184 QRS Duration: 80 QT Interval:  432 QTC Calculation: 438 R Axis:   -18 Text Interpretation:  Normal sinus rhythm Minimal voltage criteria for LVH, may  be normal variant Borderline ECG No significant change since last tracing Confirmed by Fredia Sorrow [id number]) on 03/21/2017 2:09:58 PM       Radiology No results found.  Procedures Procedures (including critical care time)  Medications Ordered in ED Medications  cefTRIAXone (ROCEPHIN) 1 g in dextrose 5 % 50 mL IVPB (0 g Intravenous Stopped 03/21/17 1700)     Initial Impression / Assessment and  Plan / ED Course  I have reviewed the triage vital signs and the nursing notes.  Pertinent labs that were available during my care of the patient were reviewed by me and considered in my medical decision making (see chart for details).     PT w/ balance problems today after walking 30 min outside, non-verbal at baseline due to dementia. Pleasant and well appearing, alert on exam. Initially 96.3 rectally. Labs show UA c/w infection, WBC 15.3, normal lactate, normal creatinine. She has remained well appearing here. Given the below-freezing temperatures outside and the fact that she had been walking for at least 30 minutes outside before presentation, I suspect that low temp is due to cold exposure rather than indicative of life threatening infection.  States she had the same presentation 6 months ago with urinary tract infection. the remainder of VS have been normal. Gave CTX and course of keflex.  Instructed to see PCP for follow-up and extensively reviewed return precautions.  Husband voiced understanding and felt comfortable with outpatient treatment plan.  Patient discharged in satisfactory condition.  Final Clinical Impressions(s) / ED Diagnoses   Final diagnoses:  Urinary tract infection without hematuria, site unspecified  Impairment of balance    ED Discharge Orders        Ordered    cephALEXin (KEFLEX) 500 MG capsule  2 times daily     03/21/17 1721       Little, Wenda Overland, MD 03/21/17 1725

## 2017-03-23 LAB — URINE CULTURE: Culture: >=100000 — AB

## 2017-03-24 ENCOUNTER — Telehealth: Payer: Self-pay | Admitting: Emergency Medicine

## 2017-03-24 NOTE — Telephone Encounter (Signed)
Post ED Visit - Positive Culture Follow-up  Culture report reviewed by antimicrobial stewardship pharmacist:  [x]  Elenor Quinones, Pharm.D. []  Heide Guile, Pharm.D., BCPS AQ-ID []  Parks Neptune, Pharm.D., BCPS []  Alycia Rossetti, Pharm.D., BCPS []  [place], [place].D., BCPS, AAHIVP []  Legrand Como, Pharm.D., BCPS, AAHIVP []  Salome Arnt, PharmD, BCPS []  Jalene Mullet, PharmD []  Vincenza Hews, PharmD, BCPS  Positive urine culture Treated with cephalexin, organism sensitive to the same and no further patient follow-up is required at this time.  [name] 03/24/2017, 11:51 AM

## 2017-03-29 DIAGNOSIS — J309 Allergic rhinitis, unspecified: Secondary | ICD-10-CM | POA: Diagnosis not present

## 2017-03-29 DIAGNOSIS — N390 Urinary tract infection, site not specified: Secondary | ICD-10-CM | POA: Diagnosis not present

## 2017-04-12 DIAGNOSIS — L84 Corns and callosities: Secondary | ICD-10-CM | POA: Diagnosis not present

## 2017-04-12 DIAGNOSIS — I739 Peripheral vascular disease, unspecified: Secondary | ICD-10-CM | POA: Diagnosis not present

## 2017-04-12 DIAGNOSIS — L603 Nail dystrophy: Secondary | ICD-10-CM | POA: Diagnosis not present

## 2017-05-16 ENCOUNTER — Ambulatory Visit (INDEPENDENT_AMBULATORY_CARE_PROVIDER_SITE_OTHER): Payer: Medicare Other | Admitting: Neurology

## 2017-05-16 ENCOUNTER — Encounter: Payer: Self-pay | Admitting: Neurology

## 2017-05-16 ENCOUNTER — Other Ambulatory Visit: Payer: Self-pay | Admitting: Neurology

## 2017-05-16 VITALS — BP 110/66 | HR 84 | Ht 62.0 in | Wt 134.0 lb

## 2017-05-16 DIAGNOSIS — F0280 Dementia in other diseases classified elsewhere without behavioral disturbance: Secondary | ICD-10-CM | POA: Diagnosis not present

## 2017-05-16 DIAGNOSIS — G301 Alzheimer's disease with late onset: Secondary | ICD-10-CM

## 2017-05-16 MED ORDER — MEMANTINE HCL-DONEPEZIL HCL 28-10 MG PO CP24
1.0000 | ORAL_CAPSULE | Freq: Every day | ORAL | 3 refills | Status: DC
Start: 2017-05-16 — End: 2017-05-16

## 2017-05-16 MED ORDER — DONEPEZIL HCL 10 MG PO TABS: 10.0000 mg | ORAL_TABLET | Freq: Every day | ORAL | 3 refills | Status: DC

## 2017-05-16 MED ORDER — MEMANTINE HCL 10 MG PO TABS: 10.0000 mg | ORAL_TABLET | Freq: Two times a day (BID) | ORAL | 3 refills | Status: DC

## 2017-05-16 NOTE — Progress Notes (Signed)
NEUROLOGY FOLLOW UP OFFICE NOTE  [name] [id number]  HISTORY OF PRESENT ILLNESS: Michelle Reeves is an 83 year old right-handed woman with hypothyroidism and hypertension who follows up for Alzheimer's disease.  She is accompanied by her husband who provides history.     UPDATE:  She is currently taking Aricept and Namenda.  She is currently with a UTI and was started on Keflex today.  She is able to use the toilet herself.  She needs somebody on standby when she showers.  She requires assistance with dressing.  Her husband manages and monitors her medications.  She does not exhibit episodes of confusion or agitation.  She is pleasant.  She does not hallucinate.  She had not had problems recognizing friends and family.  She and her husband have little conversation.  She walks a mile daily.  5 days a week, she has a caregiver who accompanies her. Often she will leave the house and either stand by or sit in the car. She sees her son daily and her daughter frequently.  She goes to a day program [facility] a [facility].    HISTORY: She has had a gradual progression of memory problems since 2013-2014.  At first, she would misplace objects.  She eventually started having trouble quickly recalling names of people she knows.  She began having trouble correctly paying the bills.  She has become more inattentive and has problems with calculations.  She is more withdrawn and doesn't converse as much anymore.  She reports difficulty getting certain words out but denies word-finding problems.  She sleeps well.  She denies depression.  She denies hallucinations or delusions.  She has not had episodes of confusion or agitation.  She no longer drives.   MRI of brain with and without contrast from 09/12/14 showed moderate generalized atrophy and chronic small vessel ischemic changes.     She has no family history of dementia.  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  PAST MEDICAL HISTORY: Past  Medical History:  Diagnosis Date  . Dementia   . Hyperlipidemia   . Hypertension   . Raynaud disease   . Thyroid disease     MEDICATIONS: Current Outpatient Medications on File Prior to Visit  Medication Sig Dispense Refill  . aspirin 81 MG tablet Take 81 mg by mouth daily.      . cephALEXin (KEFLEX) 500 MG capsule Take 500 mg by mouth 2 (two) times daily.    Marland Kitchen donepezil (ARICEPT) 10 MG tablet TAKE ONE TABLET BY MOUTH AT BEDTIME  90 tablet 0  . EPINEPHrine 0.3 mg/0.3 mL IJ SOAJ injection Inject into the muscle once.    Marland Kitchen levothyroxine (SYNTHROID, LEVOTHROID) 75 MCG tablet Take 75 mcg by mouth daily.      Marland Kitchen losartan (COZAAR) 50 MG tablet Take 50 mg by mouth daily.      . memantine (NAMENDA) 10 MG tablet TAKE ONE TABLET BY MOUTH TWICE DAILY  180 tablet 0  . Memantine HCl-Donepezil HCl (NAMZARIC) 28-10 MG CP24 Take 1 capsule by mouth daily. 30 capsule 8  . Multiple Vitamin (DAILY VITE PO) Take 1 tablet by mouth daily.      . simvastatin (ZOCOR) 80 MG tablet Take 80 mg by mouth daily.       No current facility-administered medications on file prior to visit.     ALLERGIES: Allergies  Allergen Reactions  . Bee Venom Swelling    Yellow jackets and wasps  .  Food Swelling    Cinnamon,Eggplant,Ginger,Honey,MSG,Mushrooms,Peanuts,Peanut oil,Raisins,Soy Sauce,Sesame Oil,Sesame seeds,Squash,Strawberries, Thyme,canned Tuna,& Worcestershire Sauce.  . Monosodium Glutamate Swelling  . Other Swelling  . Peanut Oil     Other reaction(s): Unknown  . Gamma Globulin [Immune Globulin, Im] Other (See Comments)    Premature birth    FAMILY HISTORY: Family History  Problem Relation Age of Onset  . Stomach cancer Mother   . Heart failure Brother   . Cancer Paternal Grandfather        stomach    SOCIAL HISTORY: Social History   Socioeconomic History  . Marital status: Married    Spouse name: Not on file  . Number of children: Not on file  . Years of education: Not on file  . Highest  education level: Not on file  Social Needs  . Financial resource strain: Not on file  . Food insecurity - worry: Not on file  . Food insecurity - inability: Not on file  . Transportation needs - medical: Not on file  . Transportation needs - non-medical: Not on file  Occupational History  . Not on file  Tobacco Use  . Smoking status: Former Smoker    Last attempt to quit: 07/19/1965    Years since quitting: 51.8  . Smokeless tobacco: Never Used  Substance and Sexual Activity  . Alcohol use: Yes    Alcohol/week: 2.4 oz    Types: 4 Glasses of wine per week  . Drug use: No  . Sexual activity: Yes    Partners: Male  Other Topics Concern  . Not on file  Social History Narrative  . Not on file    REVIEW OF SYSTEMS: Constitutional: No fevers, chills, or sweats, no generalized fatigue, change in appetite Eyes: No visual changes, double vision, eye pain Ear, nose and throat: No hearing loss, ear pain, nasal congestion, sore throat Cardiovascular: No chest pain, palpitations Respiratory:  No shortness of breath at rest or with exertion, wheezes GastrointestinaI: No nausea, vomiting, diarrhea, abdominal pain, fecal incontinence Genitourinary:  No dysuria, urinary retention or frequency Musculoskeletal:  No neck pain, back pain Integumentary: No rash, pruritus, skin lesions Neurological: as above Psychiatric: No depression, insomnia, anxiety Endocrine: No palpitations, fatigue, diaphoresis, mood swings, change in appetite, change in weight, increased thirst Hematologic/Lymphatic:  No purpura, petechiae. Allergic/Immunologic: no itchy/runny eyes, nasal congestion, recent allergic reactions, rashes  PHYSICAL EXAM: Vitals:   05/16/17 1101  BP: 110/66  Pulse: 84   General: No acute distress.  Patient appears well-groomed.   Head:  Normocephalic/atraumatic Eyes:  Fundi examined but not visualized Neck: supple, no paraspinal tenderness, full range of motion Heart:  Regular rate and  rhythm Lungs:  Clear to auscultation bilaterally Back: No paraspinal tenderness Neurological Exam: alert but unable to assess orientation due to encephalopathy related to UTI.  Attention span and concentration poor.  Decreased verbal output but speech fluent and not dysarthric.  Does not follow most commands.  Oculomotor apraxia.  Otherwise, CN II-XII intact. Bulk and tone normal, unable to assess full muscle strength or sensation.  Deep tendon reflexes 2+ throughout.  Gait normal.  IMPRESSION: Alzheimer's disease, unable to perform MMSE due to increased cognitive impairment from UTI.  PLAN: Aricept and Namenda. Continue social interaction Continue treatment of UTI 24 hour supervision Follow up in 9 months.  25 minutes spent face to face with patient, over 50% spent discussing management.  Metta Clines, DO  CC:  Dr. Harrington Challenger

## 2017-05-16 NOTE — Patient Instructions (Signed)
1.  Continue Aricept and Namenda 2.  I encourage frequent social interaction with family, caregiver and any social programs available.   RESOURCES: Development worker, community of [facility]: [phone]  Tel [place]:  [phone]  [email or web address]   Resources for common questions found under "Pathways & Protocols "  [email or web address]_Menu.htm

## 2017-06-13 DIAGNOSIS — H2513 Age-related nuclear cataract, bilateral: Secondary | ICD-10-CM | POA: Diagnosis not present

## 2017-06-13 DIAGNOSIS — H25013 Cortical age-related cataract, bilateral: Secondary | ICD-10-CM | POA: Diagnosis not present

## 2017-06-27 ENCOUNTER — Encounter: Payer: Self-pay | Admitting: Neurology

## 2017-07-12 DIAGNOSIS — L84 Corns and callosities: Secondary | ICD-10-CM | POA: Diagnosis not present

## 2017-07-12 DIAGNOSIS — L603 Nail dystrophy: Secondary | ICD-10-CM | POA: Diagnosis not present

## 2017-07-12 DIAGNOSIS — I739 Peripheral vascular disease, unspecified: Secondary | ICD-10-CM | POA: Diagnosis not present

## 2017-08-31 DIAGNOSIS — M7989 Other specified soft tissue disorders: Secondary | ICD-10-CM | POA: Diagnosis not present

## 2017-10-11 DIAGNOSIS — I739 Peripheral vascular disease, unspecified: Secondary | ICD-10-CM | POA: Diagnosis not present

## 2017-10-11 DIAGNOSIS — L84 Corns and callosities: Secondary | ICD-10-CM | POA: Diagnosis not present

## 2017-10-11 DIAGNOSIS — L603 Nail dystrophy: Secondary | ICD-10-CM | POA: Diagnosis not present

## 2017-10-21 DIAGNOSIS — I10 Essential (primary) hypertension: Secondary | ICD-10-CM | POA: Diagnosis not present

## 2017-10-21 DIAGNOSIS — E7800 Pure hypercholesterolemia, unspecified: Secondary | ICD-10-CM | POA: Diagnosis not present

## 2017-10-21 DIAGNOSIS — E039 Hypothyroidism, unspecified: Secondary | ICD-10-CM | POA: Diagnosis not present

## 2017-10-25 DIAGNOSIS — E039 Hypothyroidism, unspecified: Secondary | ICD-10-CM | POA: Diagnosis not present

## 2017-10-25 DIAGNOSIS — E7800 Pure hypercholesterolemia, unspecified: Secondary | ICD-10-CM | POA: Diagnosis not present

## 2017-10-25 DIAGNOSIS — Z0000 Encounter for general adult medical examination without abnormal findings: Secondary | ICD-10-CM | POA: Diagnosis not present

## 2017-10-25 DIAGNOSIS — I10 Essential (primary) hypertension: Secondary | ICD-10-CM | POA: Diagnosis not present

## 2017-10-25 DIAGNOSIS — J309 Allergic rhinitis, unspecified: Secondary | ICD-10-CM | POA: Diagnosis not present

## 2017-10-25 DIAGNOSIS — N390 Urinary tract infection, site not specified: Secondary | ICD-10-CM | POA: Diagnosis not present

## 2017-12-02 DIAGNOSIS — J301 Allergic rhinitis due to pollen: Secondary | ICD-10-CM | POA: Diagnosis not present

## 2017-12-02 DIAGNOSIS — Z23 Encounter for immunization: Secondary | ICD-10-CM | POA: Diagnosis not present

## 2017-12-02 DIAGNOSIS — T63451D Toxic effect of venom of hornets, accidental (unintentional), subsequent encounter: Secondary | ICD-10-CM | POA: Diagnosis not present

## 2017-12-02 DIAGNOSIS — J3089 Other allergic rhinitis: Secondary | ICD-10-CM | POA: Diagnosis not present

## 2017-12-02 DIAGNOSIS — J3081 Allergic rhinitis due to animal (cat) (dog) hair and dander: Secondary | ICD-10-CM | POA: Diagnosis not present

## 2018-01-11 ENCOUNTER — Ambulatory Visit: Payer: Medicare Other | Attending: Family Medicine

## 2018-01-11 ENCOUNTER — Other Ambulatory Visit: Payer: Self-pay

## 2018-01-11 DIAGNOSIS — M6281 Muscle weakness (generalized): Secondary | ICD-10-CM | POA: Diagnosis not present

## 2018-01-11 DIAGNOSIS — R2689 Other abnormalities of gait and mobility: Secondary | ICD-10-CM

## 2018-01-11 NOTE — Patient Instructions (Addendum)
  URL: [email or web address]  Date: 01/11/2018  Prepared by: Sigurd Sos   Exercises  Seated Long Arc Quad - 10 reps - 2 sets - 5 hold - 3x daily - 7x weekly  Seated March - 10 reps - 3 sets - 3x daily - 7x weekly  Seated Heel Raise - 10 reps - 2 sets - 3x daily - 7x weekly  Seated Isometric Hip Adduction with Ball - 10 reps - 2 sets - 3x daily - 7x weekly

## 2018-01-11 NOTE — Therapy (Signed)
[facility] [address], [place], [place], [address] Phone: [phone]   Fax:  [phone]  Physical Therapy Evaluation  Patient Details  Name: Michelle Reeves MRN: [id number] Date of Birth: Nov 24, 1934 Referring Provider (PT): Melinda Crutch, MD   Encounter Date: 01/11/2018  PT End of Session - 01/11/18 1134    Visit Number  1    Date for PT Re-Evaluation  03/08/18    Authorization Type  Medicare    PT Start Time  1056    PT Stop Time  1128    PT Time Calculation (min)  32 min    Activity Tolerance  Patient tolerated treatment well    Behavior During Therapy  [facility] for tasks assessed/performed;Impulsive;Flat affect       Past Medical History:  Diagnosis Date  . Dementia ([place])   . Hyperlipidemia   . Hypertension   . Raynaud disease   . Thyroid disease     Past Surgical History:  Procedure Laterality Date  . APPENDECTOMY    . COLONOSCOPY  12 years ago   done by Dr.Kaplan    There were no vitals filed for this visit.   Subjective Assessment - 01/11/18 1056    Subjective  Pt presents to PT with her husband with report of weakness and balance deficits. Pt with significant dementia and her husband assists with communication due to expressive aphasia.     Patient is accompained by:  Family member    Pertinent History  Alzheimers Disease- expressive aphasia    Diagnostic tests  NA    Patient Stated Goals  reduce falls risk    Currently in Pain?  No/denies         [facility] PT Assessment - 01/11/18 0001      Assessment   Medical Diagnosis  balance problems    Referring Provider (PT)  Melinda Crutch, MD    Onset Date/Surgical Date  --   2-3 year   Next MD Visit  none    Prior Therapy  none      Precautions   Precautions  Fall   significant dementia   Precaution Comments  expressive aphasia      Restrictions   Weight Bearing Restrictions  No      Balance Screen   Has the patient fallen in the past 6 months   No    Has the patient had a decrease in activity level because of a fear of falling?   No    Is the patient reluctant to leave their home because of a fear of falling?   No      Home Environment   Living Environment  Private residence    Living Arrangements  Spouse/significant other    Type of [place] to enter    [place] to live on main level with bedroom/bathroom    Additional Comments  pt has daily caregiver       Prior Function   Level of Independence  Independent    Vocation  Retired      Associate Professor   Overall Cognitive Status  History of cognitive impairments - at baseline    Memory  Impaired    Memory Impairment  Other (comment)   Alzehimers     Posture/Postural Control   Posture/Postural Control  Postural limitations    Postural Limitations  Rounded Shoulders;Forward head      ROM / Strength  AROM / PROM / Strength  AROM      AROM   Overall AROM Comments  PT was not able to formally assess pt's strength and A/ROM due to difficulty with following instructions.  Pt demonstrates all A/ROM WFLs and is able to rise from a chair without UE support.        Palpation   Palpation comment  NA      Transfers   Transfers  Sit to Stand;Stand to Sit    Sit to Stand  5: Supervision;With upper extremity assist    Stand to Sit  4: Min guard   Pt turns around to face the chair to sit down.      Ambulation/Gait   Ambulation/Gait  Yes    Gait Pattern  Step-through pattern;Decreased stride length;Shuffle    Gait Comments  loss of balance with change of direction                Objective measurements completed on examination: See above findings.              PT Education - 01/11/18 1124    Education Details   Access Code: [facility]     Person(s) Educated  Patient    Methods  Explanation;Demonstration;Handout    Comprehension  Verbalized understanding;Returned demonstration       PT Short Term Goals - 01/11/18 1135       PT SHORT TERM GOAL #1   Title  complete HEP for strength daily with help of aide or husband    Time  4    Period  Weeks    Status  New    Target Date  02/08/18      PT SHORT TERM GOAL #2   Title  perform stand to sit transition with back of legs against the chair (facing out)    Time  4    Period  Weeks    Status  New    Target Date  02/08/18      PT SHORT TERM GOAL #3   Title  improve LE strenth to perform sit to stand without UE support x 5     Time  4    Period  Weeks    Status  New    Target Date  02/08/18        PT Long Term Goals - 01/11/18 1136      PT LONG TERM GOAL #1   Title  perform HEP for strength and balance with assistance from aide or husband daily    Time  8    Period  Weeks    Status  New    Target Date  03/08/18      PT LONG TERM GOAL #2   Title  improve balance to perform change of direction on level surface without instability     Time  8    Period  Weeks    Status  New    Target Date  03/08/18      PT LONG TERM GOAL #3   Title  perform stand to sit transtion with correct technique (facing forward) and controlled descent to improve safety    Time  8    Period  Weeks    Status  New    Target Date  03/08/18      PT LONG TERM GOAL #4   Title  perform 5x sit to stand without UE support and good eccentric control to improve safety    Time  8    Period  Weeks    Status  New    Target Date  03/08/18             Plan - 01/11/18 1145    Clinical Impression Statement  Pt presents to PT with her husband at his request to address balance and strength.  Pt has Alzheimer's with expressive aphasia.  Pt's husband reports that she walks 1 mile daily with assistance of her aide and he is concerned that she doesn't do much exercise otherwise.  He has noticed that she is unstable with walking and that she performs stand to sit by turning around to face the chair due to feeling insecure.  PT was not able to formally test strength due to inability to  pt to understand instructions.  Pt demonstrates all A/ROM WFLs and at least 3+/5 UE and LE strength.  Pt performs sit to stand with assistance of her UEs and is able to perform without UEs when prompted.  Stand to sit transition is performed by facing the chair and then turning to sit down sideways.  On 2 occasions, she almost missed the edge of the chair. Pt demonstrated instability with change of direction on level surface and required tactile redirection with this. Pt was able to follow verbal and tactile cueing to perform basic seated exercise.  Pt will benefit from skilled PT to improve LE functional strength, safety with transitions and balance to improve safety at home and in the community.      History and Personal Factors relevant to plan of care:  expressive aphasia, Alzheimer's     Clinical Presentation  Evolving    Clinical Presentation due to:  unpredictable due to nature of underlying dementia and communication deficits    Clinical Decision Making  Moderate    Rehab Potential  Good    PT Frequency  2x / week    PT Duration  8 weeks    PT Treatment/Interventions  ADLs/Self Care Home Management;Balance training;Therapeutic exercise;Therapeutic activities;Functional mobility training;Stair training;Gait training;Neuromuscular re-education;Cognitive remediation;Patient/family education;Passive range of motion;Manual techniques    PT Next Visit Plan  review HEP, try NuStep, work on stand to sit    PT [place] and Agree with Plan of Care  Patient       Patient will benefit from skilled therapeutic intervention in order to improve the following deficits and impairments:  Abnormal gait, Pain, Difficulty walking, Decreased safety awareness, Decreased balance, Decreased activity tolerance, Decreased strength, Postural dysfunction  Visit Diagnosis: Other abnormalities of gait and mobility - Plan: PT plan of care cert/re-cert  Muscle weakness (generalized) -  Plan: PT plan of care cert/re-cert     Problem List Patient Active Problem List   Diagnosis Date Noted  . Alzheimer's disease ([place]) 03/12/2015  . Essential hypertension 09/05/2014  . Left knee pain 11/14/2013  . Hammertoe 11/14/2013  . SACROILIITIS 07/21/2006   Sigurd Sos, PT 01/11/18 11:49 AM  [facility] [address], [place], [place], [address] Phone: [phone]   Fax:  [phone]  Name: Michelle Reeves MRN: [id number] Date of Birth: 07-06-1934

## 2018-01-12 ENCOUNTER — Ambulatory Visit: Payer: Medicare Other | Admitting: Physical Therapy

## 2018-01-12 DIAGNOSIS — M6281 Muscle weakness (generalized): Secondary | ICD-10-CM | POA: Diagnosis not present

## 2018-01-12 DIAGNOSIS — R2689 Other abnormalities of gait and mobility: Secondary | ICD-10-CM

## 2018-01-12 NOTE — Therapy (Signed)
[facility] [address] [place], [place], [address] Phone: [phone]   Fax:  [phone]  Physical Therapy Treatment  Patient Details  Name: Michelle Reeves MRN: [id number] Date of Birth: 03/04/1934 Referring Provider (PT): Melinda Crutch, MD   Encounter Date: 01/12/2018  PT End of Session - 01/12/18 0924    Visit Number  2    Date for PT Re-Evaluation  03/08/18    Authorization Type  Medicare    PT Start Time  0805    PT Stop Time  0848    PT Time Calculation (min)  43 min    Activity Tolerance  Patient tolerated treatment well    Behavior During Therapy  [facility] for tasks assessed/performed;Impulsive;Flat affect       Past Medical History:  Diagnosis Date  . Dementia ([place])   . Hyperlipidemia   . Hypertension   . Raynaud disease   . Thyroid disease     Past Surgical History:  Procedure Laterality Date  . APPENDECTOMY    . COLONOSCOPY  12 years ago   done by Dr.Kaplan    There were no vitals filed for this visit.  Subjective Assessment - 01/12/18 0908    Subjective  Pt arrives with husband who relays she is not a morning person so she is having a tough morning with this 8 oclock appointment but that was all was available.     Patient is accompained by:  Family member   husband   Pertinent History  Alzheimers Disease- expressive aphasia    Currently in Pain?  No/denies                       [facility] Adult PT Treatment/Exercise - 01/12/18 0001      Ambulation/Gait   Gait Comments  walking 2 laps to Rt to work on Rt 90 deg turns, then 2 laps left to work on Lt turns, pt needs supervision for walking straight and min A for turning as she has difficulty following commands      High Level Balance   High Level Balance Comments  attempted sidestepping, stairs,and standing NBOS but pt not able to comprehend, She was able to negotiate between cones on floor with max tactile cuing and demonstration for  technique up/down X 3.      Exercises   Exercises  Knee/Hip      Knee/Hip Exercises: Aerobic   Recumbent Bike  5 min L1      Knee/Hip Exercises: Seated   Long Arc Quad  15 reps;Both    Ball Squeeze  5 sec  X15    Other Seated Knee/Hip Exercises  heel/toe raises X 15    Marching  Both;15 reps    Sit to [employer]  3 sets;5 reps             PT Education - 01/12/18 [phone]    Education Details  HEP review with edu for husband on giving manual facilitaiton and tactile cuing to initiate movement then she can perform it    Person(s) Educated  Patient;Spouse    Methods  Explanation;Demonstration;Tactile cues;Verbal cues       PT Short Term Goals - 01/11/18 1135      PT SHORT TERM GOAL #1   Title  complete HEP for strength daily with help of aide or husband    Time  4    Period  Weeks    Status  New  Target Date  02/08/18      PT SHORT TERM GOAL #2   Title  perform stand to sit transition with back of legs against the chair (facing out)    Time  4    Period  Weeks    Status  New    Target Date  02/08/18      PT SHORT TERM GOAL #3   Title  improve LE strenth to perform sit to stand without UE support x 5     Time  4    Period  Weeks    Status  New    Target Date  02/08/18        PT Long Term Goals - 01/11/18 1136      PT LONG TERM GOAL #1   Title  perform HEP for strength and balance with assistance from aide or husband daily    Time  8    Period  Weeks    Status  New    Target Date  03/08/18      PT LONG TERM GOAL #2   Title  improve balance to perform change of direction on level surface without instability     Time  8    Period  Weeks    Status  New    Target Date  03/08/18      PT LONG TERM GOAL #3   Title  perform stand to sit transtion with correct technique (facing forward) and controlled descent to improve safety    Time  8    Period  Weeks    Status  New    Target Date  03/08/18      PT LONG TERM GOAL #4   Title  perform 5x sit to stand without  UE support and good eccentric control to improve safety    Time  8    Period  Weeks    Status  New    Target Date  03/08/18            Plan - 01/12/18 0933    Clinical Impression Statement  Pt was taken through HEP and she needs max cuing and manual facilitation to initiate movement but then she can perform 5 or so reps before she gets distracted so she was given many breaks. She may perform beter in private treatment room but none were available this session. She was able to negotiate 90 deg turns and weaving through cones after max facilitation/assistance for turning but walking in straight line she is just supervision for balance. She did attempt sitting from standing facing the chair on first attempt but then after practice she was able to perform with appropriate technique.     Rehab Potential  Good    PT Frequency  2x / week    PT Duration  8 weeks    PT Treatment/Interventions  ADLs/Self Care Home Management;Balance training;Therapeutic exercise;Therapeutic activities;Functional mobility training;Stair training;Gait training;Neuromuscular re-education;Cognitive remediation;Patient/family education;Passive range of motion;Manual techniques    PT Next Visit Plan  nu step/bike, sit to stand, LE strength and gait/balance    PT Home Exercise Plan  [facility]    Consulted and Agree with Plan of Care  Patient       Patient will benefit from skilled therapeutic intervention in order to improve the following deficits and impairments:  Abnormal gait, Pain, Difficulty walking, Decreased safety awareness, Decreased balance, Decreased activity tolerance, Decreased strength, Postural dysfunction  Visit Diagnosis: Other abnormalities of gait and mobility  Muscle weakness (  generalized)     Problem List Patient Active Problem List   Diagnosis Date Noted  . Alzheimer's disease ([place]) 03/12/2015  . Essential hypertension 09/05/2014  . Left knee pain 11/14/2013  . Hammertoe 11/14/2013  .  SACROILIITIS 07/21/2006    Debbe Odea, PT,DPT 01/12/2018, 9:50 AM  [facility] [facility] [facility] [address], [place], [place], [address] Phone: [phone]   Fax:  [phone]  Name: Michelle Reeves MRN: [id number] Date of Birth: 1934-09-09

## 2018-01-16 DIAGNOSIS — L84 Corns and callosities: Secondary | ICD-10-CM | POA: Diagnosis not present

## 2018-01-16 DIAGNOSIS — L603 Nail dystrophy: Secondary | ICD-10-CM | POA: Diagnosis not present

## 2018-01-16 DIAGNOSIS — I739 Peripheral vascular disease, unspecified: Secondary | ICD-10-CM | POA: Diagnosis not present

## 2018-01-18 ENCOUNTER — Ambulatory Visit: Payer: Medicare Other | Admitting: Physical Therapy

## 2018-01-18 ENCOUNTER — Encounter: Payer: Self-pay | Admitting: Physical Therapy

## 2018-01-18 DIAGNOSIS — R2689 Other abnormalities of gait and mobility: Secondary | ICD-10-CM | POA: Diagnosis not present

## 2018-01-18 DIAGNOSIS — M6281 Muscle weakness (generalized): Secondary | ICD-10-CM | POA: Diagnosis not present

## 2018-01-18 NOTE — Therapy (Signed)
[facility] [address], [place], [place], [address] Phone: [phone]   Fax:  [phone]  Physical Therapy Treatment  Patient Details  Name: Michelle Reeves MRN: [id number] Date of Birth: 1934/09/24 Referring Provider (PT): Melinda Crutch, MD   Encounter Date: 01/18/2018  PT End of Session - 01/18/18 1501    Visit Number  3    Date for PT Re-Evaluation  03/08/18    Authorization Type  Medicare    PT Start Time  6962    PT Stop Time  1522    PT Time Calculation (min)  26 min    Activity Tolerance  Patient tolerated treatment well    Behavior During Therapy  [facility] for tasks assessed/performed;Impulsive;Flat affect       Past Medical History:  Diagnosis Date  . Dementia ([place])   . Hyperlipidemia   . Hypertension   . Raynaud disease   . Thyroid disease     Past Surgical History:  Procedure Laterality Date  . APPENDECTOMY    . COLONOSCOPY  12 years ago   done by Dr.Kaplan    There were no vitals filed for this visit.  Subjective Assessment - 01/18/18 1516    Pertinent History  Alzheimers Disease- expressive aphasia                       [facility] Adult PT Treatment/Exercise - 01/18/18 0001      Neuro Re-ed    Neuro Re-ed Details   PTA max guided pt walking and changing direction,       Knee/Hip Exercises: Aerobic   Recumbent Bike  Nustep L1 x 6 min   PTA present to discuss status with husband     Knee/Hip Exercises: Seated   Long [employer]  --   Attempted: pt could not follow cues to do actively   [facility]  Maybe 3x    Marching  --   Could not follow commands: pt could not actively do   Sit to [employer]  --   Pt did this 4x as part of our "field trips" to get water              PT Short Term Goals - 01/11/18 1135      PT SHORT TERM GOAL #1   Title  complete HEP for strength daily with help of aide or husband    Time  4    Period  Weeks    Status  New    Target Date  02/08/18       PT SHORT TERM GOAL #2   Title  perform stand to sit transition with back of legs against the chair (facing out)    Time  4    Period  Weeks    Status  New    Target Date  02/08/18      PT SHORT TERM GOAL #3   Title  improve LE strenth to perform sit to stand without UE support x 5     Time  4    Period  Weeks    Status  New    Target Date  02/08/18        PT Long Term Goals - 01/11/18 1136      PT LONG TERM GOAL #1   Title  perform HEP for strength and balance with assistance from aide or husband daily    Time  8    Period  Weeks  Status  New    Target Date  03/08/18      PT LONG TERM GOAL #2   Title  improve balance to perform change of direction on level surface without instability     Time  8    Period  Weeks    Status  New    Target Date  03/08/18      PT LONG TERM GOAL #3   Title  perform stand to sit transtion with correct technique (facing forward) and controlled descent to improve safety    Time  8    Period  Weeks    Status  New    Target Date  03/08/18      PT LONG TERM GOAL #4   Title  perform 5x sit to stand without UE support and good eccentric control to improve safety    Time  8    Period  Weeks    Status  New    Target Date  03/08/18            Plan - 01/18/18 1501    Clinical Impression Statement  Pt could not follow any commands for any seated ther ex today. PTA was performing the exercises ( LAQ, marching, hip abd) passively only. PTA could get pt to get out of her chair ( no assistance getting up) walk to water to get water and walk back to work on forward facing sitting. PTA guided her trunk to turn in order to accomplish full forward face. Worked on change of direction when walking ( around the gym) with hand held assist. Pt demonstrated no awarness of her surroundings but stability was actually pretty good. PTA max cued everything.     Rehab Potential  Good    PT Frequency  2x / week    PT Duration  8 weeks    PT  Treatment/Interventions  ADLs/Self Care Home Management;Balance training;Therapeutic exercise;Therapeutic activities;Functional mobility training;Stair training;Gait training;Neuromuscular re-education;Cognitive remediation;Patient/family education;Passive range of motion;Manual techniques    PT Next Visit Plan  nu step/bike, sit to stand, LE strength and gait/balance    PT Home Exercise Plan  [facility]    Consulted and Agree with Plan of Care  Patient       Patient will benefit from skilled therapeutic intervention in order to improve the following deficits and impairments:  Abnormal gait, Pain, Difficulty walking, Decreased safety awareness, Decreased balance, Decreased activity tolerance, Decreased strength, Postural dysfunction  Visit Diagnosis: Other abnormalities of gait and mobility  Muscle weakness (generalized)     Problem List Patient Active Problem List   Diagnosis Date Noted  . Alzheimer's disease ([place]) 03/12/2015  . Essential hypertension 09/05/2014  . Left knee pain 11/14/2013  . Hammertoe 11/14/2013  . SACROILIITIS 07/21/2006    Angelika Jerrett, PTA 01/18/2018, 5:02 PM  [facility] [address], [place], [place], [address] Phone: [phone]   Fax:  [phone]  Name: Michelle Reeves MRN: [id number] Date of Birth: Jun 22, 1934

## 2018-01-25 ENCOUNTER — Ambulatory Visit: Payer: Medicare Other | Admitting: Physical Therapy

## 2018-01-25 ENCOUNTER — Encounter: Payer: Self-pay | Admitting: Physical Therapy

## 2018-01-25 DIAGNOSIS — M6281 Muscle weakness (generalized): Secondary | ICD-10-CM | POA: Diagnosis not present

## 2018-01-25 DIAGNOSIS — R2689 Other abnormalities of gait and mobility: Secondary | ICD-10-CM

## 2018-01-25 NOTE — Therapy (Signed)
[facility] [address] [place], [place], [address] Phone: [phone]   Fax:  [phone]  Physical Therapy Treatment  Patient Details  Name: Michelle Reeves MRN: [id number] Date of Birth: 15-Jan-1935 Referring Provider (PT): Melinda Crutch, MD   Encounter Date: 01/25/2018  PT End of Session - 01/25/18 0933    Visit Number  4    Date for PT Re-Evaluation  03/08/18    Authorization Type  Medicare    PT Start Time  0933    PT Stop Time  1015    PT Time Calculation (min)  42 min    Activity Tolerance  Patient tolerated treatment well    Behavior During Therapy  [facility] for tasks assessed/performed;Impulsive;Flat affect       Past Medical History:  Diagnosis Date  . Dementia ([place])   . Hyperlipidemia   . Hypertension   . Raynaud disease   . Thyroid disease     Past Surgical History:  Procedure Laterality Date  . APPENDECTOMY    . COLONOSCOPY  12 years ago   done by Dr.Kaplan    There were no vitals filed for this visit.  Subjective Assessment - 01/25/18 0937    Subjective  Husband reports no new complaints, issues.    Patient is accompained by:  Family member    Pertinent History  Alzheimers Disease- expressive aphasia    Patient Stated Goals  reduce falls risk    Currently in Pain?  No/denies    Multiple Pain Sites  No                       [facility] Adult PT Treatment/Exercise - 01/25/18 0001      Knee/Hip Exercises: Aerobic   Stationary Bike  6 min pt's pace, pt could get on/off equipment with good balance/steadiness    Recumbent Bike  Nustep L1 x 8 min   PTA present to discuss status with husband     Knee/Hip Exercises: Standing   Heel Raises  --   Attempted; pt could not process.    Heel Raises Limitations  Attempted roling ball on high table to facilitate some weight shifting.  Pt could not process/ accomplish.     Hip Abduction  --   Attempted clamshells with red band: Pt could not do   Abduction Limitations  PTA attempted demonstration and moving pt's pts femur for to stimulate the movement. Unsuccessful.     Stairs  Using rails, up and down 4 steps. Pt froze at top of stairs: pt needs constant physical redirection on what to do    Other Standing Knee Exercises  On black pad; cones to the second shelf, needs constant physical redirection to task.     Other Standing Knee Exercises  Sit to stand multile times throughout the session : pt can independently stand and showed improvement sitting, getting 80%of her pelvis around.    Walking around room to negotiate corners/ turns.               PT Short Term Goals - 01/25/18 0942      PT SHORT TERM GOAL #1   Title  complete HEP for strength daily with help of aide or husband    Time  4    Period  Weeks    Status  On-going        PT Long Term Goals - 01/11/18 1136      PT LONG TERM GOAL #  1   Title  perform HEP for strength and balance with assistance from aide or husband daily    Time  8    Period  Weeks    Status  New    Target Date  03/08/18      PT LONG TERM GOAL #2   Title  improve balance to perform change of direction on level surface without instability     Time  8    Period  Weeks    Status  New    Target Date  03/08/18      PT LONG TERM GOAL #3   Title  perform stand to sit transtion with correct technique (facing forward) and controlled descent to improve safety    Time  8    Period  Weeks    Status  New    Target Date  03/08/18      PT LONG TERM GOAL #4   Title  perform 5x sit to stand without UE support and good eccentric control to improve safety    Time  8    Period  Weeks    Status  New    Target Date  03/08/18            Plan - 01/25/18 1021    Clinical Impression Statement  Pt requires constant physical redirection to the task at hand. Once she is in a rhythm she can sometimes follow through and finish the task ( Nustep, cone stack, stationary bike: all most successful). Pt  demonstrated the ability to get her pelvis around close to 80% before sitting  vs  a complete side sit. Pt was not able to complete traditional LE exercises. Could not process what to do.     Rehab Potential  Good    PT Frequency  2x / week    PT Duration  8 weeks    PT Treatment/Interventions  ADLs/Self Care Home Management;Balance training;Therapeutic exercise;Therapeutic activities;Functional mobility training;Stair training;Gait training;Neuromuscular re-education;Cognitive remediation;Patient/family education;Passive range of motion;Manual techniques    PT Next Visit Plan  nu step/bike, sit to stand, LE strength and gait/balance. Plan to discuss with PT DC plan.    PT Home Exercise Plan  [id number]    Consulted and Agree with Plan of Care  Patient       Patient will benefit from skilled therapeutic intervention in order to improve the following deficits and impairments:  Abnormal gait, Pain, Difficulty walking, Decreased safety awareness, Decreased balance, Decreased activity tolerance, Decreased strength, Postural dysfunction  Visit Diagnosis: Other abnormalities of gait and mobility  Muscle weakness (generalized)     Problem List Patient Active Problem List   Diagnosis Date Noted  . Alzheimer's disease ([place]) 03/12/2015  . Essential hypertension 09/05/2014  . Left knee pain 11/14/2013  . Hammertoe 11/14/2013  . SACROILIITIS 07/21/2006    Shazia Mitchener, PTA 01/25/2018, 10:29 AM  [facility] [address] [place], [place], [address] Phone: [phone]   Fax:  [phone]  Name: Michelle Reeves MRN: [id number] Date of Birth: 07-04-1934

## 2018-02-01 ENCOUNTER — Encounter: Payer: Self-pay | Admitting: Physical Therapy

## 2018-02-01 ENCOUNTER — Ambulatory Visit: Payer: Medicare Other | Attending: Family Medicine | Admitting: Physical Therapy

## 2018-02-01 DIAGNOSIS — M6281 Muscle weakness (generalized): Secondary | ICD-10-CM | POA: Diagnosis not present

## 2018-02-01 DIAGNOSIS — R2689 Other abnormalities of gait and mobility: Secondary | ICD-10-CM

## 2018-02-01 NOTE — Therapy (Signed)
[facility] [address], [place], [place], [address] Phone: [phone]   Fax:  [phone]  Physical Therapy Treatment  Patient Details  Name: Michelle Reeves MRN: [id number] Date of Birth: [birth year] Referring Provider (PT): Melinda Crutch, MD   Encounter Date: 02/01/2018  PT End of Session - 02/01/18 0934    Visit Number  5    Date for PT Re-Evaluation  03/08/18    Authorization Type  Medicare    PT Start Time  0932   Pt having difficulties following commands   PT Stop Time  1002    PT Time Calculation (min)  30 min    Activity Tolerance  Patient tolerated treatment well    Behavior During Therapy  [facility] for tasks assessed/performed;Impulsive;Flat affect   Pt requires physical redirection      Past Medical History:  Diagnosis Date  . Dementia ([place])   . Hyperlipidemia   . Hypertension   . Raynaud disease   . Thyroid disease     Past Surgical History:  Procedure Laterality Date  . APPENDECTOMY    . COLONOSCOPY  12 years ago   done by Dr.Kaplan    There were no vitals filed for this visit.  Subjective Assessment - 02/01/18 0935    Patient is accompained by:  Family member    Pertinent History  Alzheimers Disease- expressive aphasia    Currently in Pain?  No/denies                       [facility] Adult PT Treatment/Exercise - 02/01/18 0001      Knee/Hip Exercises: Aerobic   Stationary Bike  7 min L0-1    Recumbent Bike  Nustep L1 x 10 min   PTA present to discuss status with husband     Knee/Hip Exercises: Standing   Other Standing Knee Exercises  On black pad; cones to the second shelf, needs constant physical redirection to task.     Other Standing Knee Exercises  Sit to stand multile times throughout the session : pt can independently stand. Not consistent with turning her body today.    Walking around room to negotiate corners/ turns.      Knee/Hip Exercises: Seated   Long Arc Quad  --    Attempted kicking ball or cones to activate quads:               PT Short Term Goals - 01/25/18 0942      PT SHORT TERM GOAL #1   Title  complete HEP for strength daily with help of aide or husband    Time  4    Period  Weeks    Status  On-going        PT Long Term Goals - 01/11/18 1136      PT LONG TERM GOAL #1   Title  perform HEP for strength and balance with assistance from aide or husband daily    Time  8    Period  Weeks    Status  New    Target Date  03/08/18      PT LONG TERM GOAL #2   Title  improve balance to perform change of direction on level surface without instability     Time  8    Period  Weeks    Status  New    Target Date  03/08/18      PT LONG TERM GOAL #3  Title  perform stand to sit transtion with correct technique (facing forward) and controlled descent to improve safety    Time  8    Period  Weeks    Status  New    Target Date  03/08/18      PT LONG TERM GOAL #4   Title  perform 5x sit to stand without UE support and good eccentric control to improve safety    Time  8    Period  Weeks    Status  New    Target Date  03/08/18            Plan - 02/01/18 1010    Clinical Impression Statement  Pt again completes rhythmic activies well as her body tends to do well with these kids of activites (walking, riding stationary bike). She did not do as well today turning around to sit, she needed extra time to figure out how to turn even with max verbal and  tactile cues to turn. Pt will continues to walk with her aide for exercise. PTA spoke with husband and PT about pt's inability to learn. They agreed and Friday will be our last sesison.    Rehab Potential  Good    PT Frequency  2x / week    PT Duration  8 weeks    PT Treatment/Interventions  ADLs/Self Care Home Management;Balance training;Therapeutic exercise;Therapeutic activities;Functional mobility training;Stair training;Gait training;Neuromuscular re-education;Cognitive  remediation;Patient/family education;Passive range of motion;Manual techniques    PT Next Visit Plan  DC next visit. Spoke with PT and pt's husband who all agree to DC on Friday.     PT Home Exercise Plan  [id number]    Consulted and Agree with Plan of Care  Patient       Patient will benefit from skilled therapeutic intervention in order to improve the following deficits and impairments:  Abnormal gait, Pain, Difficulty walking, Decreased safety awareness, Decreased balance, Decreased activity tolerance, Decreased strength, Postural dysfunction  Visit Diagnosis: Other abnormalities of gait and mobility  Muscle weakness (generalized)     Problem List Patient Active Problem List   Diagnosis Date Noted  . Alzheimer's disease ([place]) 03/12/2015  . Essential hypertension 09/05/2014  . Left knee pain 11/14/2013  . Hammertoe 11/14/2013  . SACROILIITIS 07/21/2006    COCHRAN,JENNIFER, PTA  02/01/2018, 10:14 AM  [facility] [address] [place], [place], [address] Phone: [phone]   Fax:  [phone]  Name: Michelle Reeves MRN: [id number] Date of Birth: 1935/02/11

## 2018-02-03 ENCOUNTER — Encounter: Payer: Self-pay | Admitting: Physical Therapy

## 2018-02-03 ENCOUNTER — Ambulatory Visit: Payer: Medicare Other | Admitting: Physical Therapy

## 2018-02-03 DIAGNOSIS — R2689 Other abnormalities of gait and mobility: Secondary | ICD-10-CM | POA: Diagnosis not present

## 2018-02-03 DIAGNOSIS — M6281 Muscle weakness (generalized): Secondary | ICD-10-CM | POA: Diagnosis not present

## 2018-02-03 NOTE — Therapy (Addendum)
[facility] [address], [place], [place], [address] Phone: [phone]   Fax:  [phone]  Physical Therapy Treatment  Patient Details  Name: Michelle Reeves MRN: [id number] Date of Birth: May 11, 1934 Referring Provider (PT): Melinda Crutch, MD   Encounter Date: 02/03/2018  PT End of Session - 02/03/18 0935    Visit Number  6    Date for PT Re-Evaluation  03/08/18    Authorization Type  Medicare    PT Start Time  0931    PT Stop Time  1000    PT Time Calculation (min)  29 min    Activity Tolerance  Patient tolerated treatment well   Pt cannot follow commands very well.     Behavior During Therapy  [facility] for tasks assessed/performed;Impulsive;Flat affect       Past Medical History:  Diagnosis Date  . Dementia ([place])   . Hyperlipidemia   . Hypertension   . Raynaud disease   . Thyroid disease     Past Surgical History:  Procedure Laterality Date  . APPENDECTOMY    . COLONOSCOPY  12 years ago   done by Dr.Kaplan    There were no vitals filed for this visit.  Subjective Assessment - 02/03/18 0936    Subjective  Husband reports no new complaints, issues.    Patient is accompained by:  Family member    Pertinent History  Alzheimers Disease- expressive aphasia    Currently in Pain?  No/denies    Multiple Pain Sites  No                       [facility] Adult PT Treatment/Exercise - 02/03/18 0001      Knee/Hip Exercises: Aerobic   Stationary Bike  8 min L0-1    PTA present to keep pt on task   Recumbent Bike  Nustep L1 x 10 min   PTA present to discuss status with husband              PT Short Term Goals - 02/03/18 1008      PT SHORT TERM GOAL #1   Title  complete HEP for strength daily with help of aide or husband    Time  4    Period  Weeks    Status  Not Met   Pt unbale to learn new exercises. She will continue to use her walking as her main HEP with the potential to go to the [facility]  with husband to use Nustep/stationary bike.      PT SHORT TERM GOAL #2   Title  perform stand to sit transition with back of legs against the chair (facing out)    Time  4    Period  Weeks    Status  Not Met   Pt shows no consistency even when verbally or manually cued.      PT SHORT TERM GOAL #3   Title  improve LE strenth to perform sit to stand without UE support x 5     Time  4    Period  Weeks    Status  Achieved   Pt demonstrates standing without use of her UE multiple times during her PT session.        PT Long Term Goals - 02/03/18 1010      PT LONG TERM GOAL #1   Title  perform HEP for strength and balance with assistance from aide or husband daily  Period  Weeks    Status  Not Met   Unable to learn     PT LONG TERM GOAL #2   Title  improve balance to perform change of direction on level surface without instability     Time  8    Period  Weeks    Status  Achieved   Pt demonstrates ability to change direction without any loss of balance.      PT LONG TERM GOAL #3   Title  perform stand to sit transtion with correct technique (facing forward) and controlled descent to improve safety    Time  8    Period  Weeks    Status  Not Met      PT LONG TERM GOAL #4   Title  perform 5x sit to stand without UE support and good eccentric control to improve safety    Time  8    Period  Weeks    Status  Partially Met   Pt can perfrom as a singular event, her level of cognition did not allow to perform repetitively as an exercise, BUT she can do this consistently throughout her session.           Plan - 02/03/18 0935    Clinical Impression Statement  Pt demonstrates success when she gets into a rhythmic type of exercise: walking, riding stationary bike. Discussed this observation with husband. Suggested if he is interested in some other form of exercise besides her walkig they could consider going to the [facility] where she can use these kinds of machines. She tolerates up  to 10 minutes in the clinic. She demonstrates consistently getting up out of her wihtout hte use of her UE but there is no consistency in the manner in which she sits down. At times she seem sto understand she needs to turn her body around, at times she cannot figure this out at all. Husband does use the strategy of manually shifting her pelvis around ( the best he can) to increase her safety when sitting. Due to pt's inability to learn ( HEP, safer techniques when sitting) all parties agree to discharge today.     Rehab Potential  Good    PT Frequency  2x / week    PT Duration  8 weeks    PT Treatment/Interventions  ADLs/Self Care Home Management;Balance training;Therapeutic exercise;Therapeutic activities;Functional mobility training;Stair training;Gait training;Neuromuscular re-education;Cognitive remediation;Patient/family education;Passive range of motion;Manual techniques    PT Next Visit Plan  Discharge    PT Home Exercise Plan  [facility]    Consulted and Agree with Plan of Care  Patient       Patient will benefit from skilled therapeutic intervention in order to improve the following deficits and impairments:  Abnormal gait, Pain, Difficulty walking, Decreased safety awareness, Decreased balance, Decreased activity tolerance, Decreased strength, Postural dysfunction  Visit Diagnosis: Other abnormalities of gait and mobility  Muscle weakness (generalized)     Problem List Patient Active Problem List   Diagnosis Date Noted  . Alzheimer's disease ([place]) 03/12/2015  . Essential hypertension 09/05/2014  . Left knee pain 11/14/2013  . Hammertoe 11/14/2013  . SACROILIITIS 07/21/2006    Myrene Galas, PTA 02/03/18 10:13 AM  PHYSICAL THERAPY DISCHARGE SUMMARY  Visits from Start of Care: 6  Current functional level related to goals / functional outcomes: See above status.  Due to inability to follow cues in the clinic, pt will be discharged to HEP for walking and endurance  exercise.  Remaining deficits: See above.     Education / Equipment: PT/PTA educated pt's husband regarding regular walking program and performance of bike or Nustep. Plan: Patient agrees to discharge.  Patient goals were partially met. Patient is being discharged due to lack of progress.  ?????        Kelly Takacs, PT 02/06/18 8:20 AM  [facility] [address], [address] [place], [place], [address] Phone: [phone]   Fax:  [phone]  Name: Colton R Brazel MRN: [id number] Date of Birth: 09/21/1934   

## 2018-02-08 ENCOUNTER — Encounter: Payer: Medicare Other | Admitting: Physical Therapy

## 2018-02-13 ENCOUNTER — Encounter: Payer: Medicare Other | Admitting: Physical Therapy

## 2018-02-14 NOTE — Progress Notes (Signed)
NEUROLOGY FOLLOW UP OFFICE NOTE  JAI BEAR [id number]  HISTORY OF PRESENT ILLNESS: Michelle Reeves is an 83 year old right-handed woman with hypothyroidism and hypertension who follows up for Alzheimer's disease.  She is accompanied by her husband who supplements history.  UPDATE:  She currently takes Aricept and Namenda.  She requires guidance and assistance with all ADLs.  Her eating takes more guidance and prompting.  She follows instructions.  She is a little more incontinent.  She has two caregivers that help at home.  She has 24 hour supervision between her husband and the caregivers.  She spends 1 day a week at the new memory care center at [facility]  She has wandered out on two occasions. Sometimes she may attempt to get out of bed if she wakes up at night.  She does not wander out of the house at night.    HISTORY: She has had a gradual progression of memory problems since 2013-2014. At first, she would misplace objects. She eventually started having trouble quickly recalling names of people she knows. She began having trouble correctly paying the bills. She has become more inattentive and has problems with calculations. She is more withdrawn and doesn't converse as much anymore. She reports difficulty getting certain words out but denies word-finding problems. She sleeps well. She denies depression. She denies hallucinations or delusions. She has not had episodes of confusion or agitation. She no longer drives.  MRI of brain with and without contrast from 09/12/14 showed moderate generalized atrophy and chronic small vessel ischemic changes.    She has no family history of dementia. 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  PAST MEDICAL HISTORY: Past Medical History:  Diagnosis Date  . Dementia ([place])   . Hyperlipidemia   . Hypertension   . Raynaud disease   . Thyroid disease     MEDICATIONS: Current Outpatient Medications on File Prior to Visit    Medication Sig Dispense Refill  . aspirin 81 MG tablet Take 81 mg by mouth daily.      . cephALEXin (KEFLEX) 500 MG capsule Take 500 mg by mouth 2 (two) times daily.    Marland Kitchen donepezil (ARICEPT) 10 MG tablet Take 1 tablet (10 mg total) by mouth at bedtime. 90 tablet 3  . EPINEPHrine 0.3 mg/0.3 mL IJ SOAJ injection Inject into the muscle once.    Marland Kitchen levothyroxine (SYNTHROID, LEVOTHROID) 75 MCG tablet Take 75 mcg by mouth daily.      Marland Kitchen losartan (COZAAR) 50 MG tablet Take 50 mg by mouth daily.      . memantine (NAMENDA) 10 MG tablet Take 1 tablet (10 mg total) by mouth 2 (two) times daily. 180 tablet 3  . Multiple Vitamin (DAILY VITE PO) Take 1 tablet by mouth daily.      . simvastatin (ZOCOR) 80 MG tablet Take 80 mg by mouth daily.       No current facility-administered medications on file prior to visit.     ALLERGIES: Allergies  Allergen Reactions  . Bee Venom Swelling    Yellow jackets and wasps  . Food Swelling    Cinnamon,Eggplant,Ginger,Honey,MSG,Mushrooms,Peanuts,Peanut oil,Raisins,Soy Sauce,Sesame Oil,Sesame seeds,Squash,Strawberries, Thyme,canned Tuna,& Worcestershire Sauce.  . Monosodium Glutamate Swelling  . Other Swelling  . Peanut Oil     Other reaction(s): Unknown  . Gamma Globulin [Immune Globulin, Im] Other (See Comments)    Premature birth    FAMILY HISTORY: Family History  Problem Relation Age of Onset  . Stomach cancer Mother   .  Heart failure Brother   . Cancer Paternal Grandfather        stomach    SOCIAL HISTORY: Social History   Socioeconomic History  . Marital status: Married    Spouse name: Not on file  . Number of children: Not on file  . Years of education: Not on file  . Highest education level: Not on file  Occupational History  . Not on file  Social Needs  . Financial resource strain: Not on file  . Food insecurity:    Worry: Not on file    Inability: Not on file  . Transportation needs:    Medical: Not on file    Non-medical: Not on  file  Tobacco Use  . Smoking status: Former Smoker    Last attempt to quit: 07/19/1965    Years since quitting: 52.6  . Smokeless tobacco: Never Used  Substance and Sexual Activity  . Alcohol use: Yes    Alcohol/week: 4.0 standard drinks    Types: 4 Glasses of wine per week  . Drug use: No  . Sexual activity: Yes    Partners: Male  Lifestyle  . Physical activity:    Days per week: Not on file    Minutes per session: Not on file  . Stress: Not on file  Relationships  . Social connections:    Talks on phone: Not on file    Gets together: Not on file    Attends religious service: Not on file    Active member of club or organization: Not on file    Attends meetings of clubs or organizations: Not on file    Relationship status: Not on file  . Intimate partner violence:    Fear of current or ex partner: Not on file    Emotionally abused: Not on file    Physically abused: Not on file    Forced sexual activity: Not on file  Other Topics Concern  . Not on file  Social History Narrative  . Not on file    REVIEW OF SYSTEMS: Constitutional: No fevers, chills, or sweats, no generalized fatigue, change in appetite Eyes: No visual changes, double vision, eye pain Ear, nose and throat: No hearing loss, ear pain, nasal congestion, sore throat Cardiovascular: No chest pain, palpitations Respiratory:  No shortness of breath at rest or with exertion, wheezes GastrointestinaI: No nausea, vomiting, diarrhea, abdominal pain Genitourinary: Incontinent Musculoskeletal:  No neck pain, back pain Integumentary: No rash, pruritus, skin lesions Neurological: as above Psychiatric: No depression, insomnia, anxiety Endocrine: No palpitations, fatigue, diaphoresis, mood swings, change in appetite, change in weight, increased thirst Hematologic/Lymphatic:  No purpura, petechiae. Allergic/Immunologic: no itchy/runny eyes, nasal congestion, recent allergic reactions, rashes  PHYSICAL EXAM: Blood  pressure (!) 158/84, pulse (!) 45, height 5\' 2"  (1.575 m), weight 138 lb (62.6 kg), SpO2 96 %. General: No acute distress.  Patient appears poorly-groomed.   Head:  Normocephalic/atraumatic Eyes:  Fundi examined but not visualized Neck: supple, no paraspinal tenderness, full range of motion Heart:  Regular rate and rhythm Lungs:  Clear to auscultation bilaterally Back: No paraspinal tenderness Neurological Exam: Patient is alert but not oriented.  Attention span and concentration poor.  She is nonverbal.  Does not follow commands.  Face symmetric.  Otherwise, unable to assess CN II-XII.  Bulk and tone normal.  Unable to assess full muscle strength or sensation.  Deep tendon reflexes 2+ throughout.  Gait normal.  IMPRESSION: Alzheimer's disease, advanced  PLAN: 1.  Continue Aricept and  Namenda 2.  Continue 24 hours supervision 3.  Continue to encourage social interaction 4.  Follow-up in 12 months.  25 minutes spent face-to-face with patient and her husband, over 50% spent discussing management of care.  Metta Clines, DO  CC: C. Melinda Crutch, MD

## 2018-02-16 ENCOUNTER — Ambulatory Visit (INDEPENDENT_AMBULATORY_CARE_PROVIDER_SITE_OTHER): Payer: Medicare Other | Admitting: Neurology

## 2018-02-16 ENCOUNTER — Encounter: Payer: Self-pay | Admitting: Neurology

## 2018-02-16 VITALS — BP 158/84 | HR 45 | Ht 62.0 in | Wt 138.0 lb

## 2018-02-16 DIAGNOSIS — G301 Alzheimer's disease with late onset: Secondary | ICD-10-CM | POA: Diagnosis not present

## 2018-02-16 DIAGNOSIS — F0280 Dementia in other diseases classified elsewhere without behavioral disturbance: Secondary | ICD-10-CM | POA: Diagnosis not present

## 2018-04-21 DIAGNOSIS — L603 Nail dystrophy: Secondary | ICD-10-CM | POA: Diagnosis not present

## 2018-04-21 DIAGNOSIS — L84 Corns and callosities: Secondary | ICD-10-CM | POA: Diagnosis not present

## 2018-04-21 DIAGNOSIS — I739 Peripheral vascular disease, unspecified: Secondary | ICD-10-CM | POA: Diagnosis not present

## 2018-05-13 ENCOUNTER — Other Ambulatory Visit: Payer: Self-pay | Admitting: Neurology

## 2018-05-18 ENCOUNTER — Other Ambulatory Visit: Payer: Self-pay | Admitting: Neurology

## 2018-05-18 ENCOUNTER — Telehealth: Payer: Self-pay

## 2018-05-18 NOTE — Telephone Encounter (Signed)
Husband Legrand Como called to ask if ok Pt uses a thickner in liquids. I advised, per Dr. Tomi Likens, that is ok.

## 2018-06-26 ENCOUNTER — Telehealth: Payer: Self-pay | Admitting: Neurology

## 2018-06-26 NOTE — Telephone Encounter (Signed)
Patient's spouse called regarding a recommendation for a Diet. He said that Rashi has Dysphasia, Drooling and eating slow. He would like to speak with someone. Thanks

## 2018-06-28 NOTE — Telephone Encounter (Signed)
Called and spoke with Michelle Reeves. He will discuss this further at his appt in June. Pt is using a thickening powder for liquids which has helped.

## 2018-06-28 NOTE — Telephone Encounter (Signed)
She will need to see a speech therapist to determine the diet.  A modified barium swallow may be needed.

## 2018-09-05 ENCOUNTER — Telehealth: Payer: Self-pay | Admitting: Neurology

## 2018-09-05 NOTE — Telephone Encounter (Signed)
Husband is calling in about wife's condition and wants to speak with a nurse. Thanks!

## 2018-09-06 NOTE — Telephone Encounter (Signed)
Attempted to return patients husbands call but had to leave a message.

## 2018-09-07 NOTE — Telephone Encounter (Addendum)
Legrand Como returned call. Pt has been taking Cipro 500 mg TID, this is the 7th day. He has test strips and Pt is still testing slightly for UTI. He wanted advice on whether he should continue antibiotics. Apparently Dr. Harrington Challenger provides this to Pt as she has frequent UTI's.  I advised Michelle Reeves he should contact Dr/ Harrington Challenger' office and arrange to have UA and culture

## 2018-09-07 NOTE — Telephone Encounter (Signed)
LMOM for Legrand Como to return call

## 2018-10-10 ENCOUNTER — Telehealth: Payer: Self-pay | Admitting: Neurology

## 2018-10-10 NOTE — Telephone Encounter (Signed)
Caller left msg with after hours about needing permission to terminate RX for the patient. Thanks!

## 2018-10-11 NOTE — Telephone Encounter (Signed)
Called LMOVM for Legrand Como to return my call

## 2018-10-18 ENCOUNTER — Telehealth: Payer: Self-pay | Admitting: Neurology

## 2018-10-18 NOTE — Telephone Encounter (Signed)
Husband is calling in wanting you to call him back at this # instead.  [phone]

## 2018-10-18 NOTE — Telephone Encounter (Signed)
Called and spoke with Pt's husband, Micheal. He states Pt has started leaning to the left while sitting and while walking. He looked up side effects and the donepezil lists this as "leaning [place] of [place]". He said stopping both memantine and donepezil was discussed at last visit. He has decided he would like to do that, but does not want to without Dr Georgie Chard instruction.

## 2018-10-18 NOTE — Telephone Encounter (Signed)
Called and LMOVM for Legrand Como that I had attempted to reach him and LM previously on his VM. I also advised him if they are still using My Chart, he could send a message through there as well.

## 2018-10-18 NOTE — Telephone Encounter (Signed)
Husband said he has been trying for about 10 days to get a return call about wife's medication. Donepezil medication. He left VM about this.

## 2018-10-18 NOTE — Telephone Encounter (Signed)
Attempted to reach Pt's husband Legrand Como again, no answer, LMOVM for return call.

## 2018-10-19 NOTE — Telephone Encounter (Signed)
It's rare, but that side effect has been described with donepezil.  He may just stop both medications (no tapering needed).

## 2018-10-23 NOTE — Telephone Encounter (Signed)
Left message for patient to call back  

## 2018-11-13 DIAGNOSIS — F0390 Unspecified dementia without behavioral disturbance: Secondary | ICD-10-CM | POA: Diagnosis not present

## 2018-11-13 DIAGNOSIS — I10 Essential (primary) hypertension: Secondary | ICD-10-CM | POA: Diagnosis not present

## 2018-11-13 DIAGNOSIS — Z0000 Encounter for general adult medical examination without abnormal findings: Secondary | ICD-10-CM | POA: Diagnosis not present

## 2018-11-13 DIAGNOSIS — E7800 Pure hypercholesterolemia, unspecified: Secondary | ICD-10-CM | POA: Diagnosis not present

## 2018-11-13 DIAGNOSIS — S0101XA Laceration without foreign body of scalp, initial encounter: Secondary | ICD-10-CM | POA: Diagnosis not present

## 2018-11-13 DIAGNOSIS — E039 Hypothyroidism, unspecified: Secondary | ICD-10-CM | POA: Diagnosis not present

## 2018-11-13 DIAGNOSIS — Z23 Encounter for immunization: Secondary | ICD-10-CM | POA: Diagnosis not present

## 2018-11-24 DIAGNOSIS — R001 Bradycardia, unspecified: Secondary | ICD-10-CM | POA: Diagnosis not present

## 2018-11-24 DIAGNOSIS — R0689 Other abnormalities of breathing: Secondary | ICD-10-CM | POA: Diagnosis not present

## 2018-11-24 DIAGNOSIS — R404 Transient alteration of awareness: Secondary | ICD-10-CM | POA: Diagnosis not present

## 2018-11-24 DIAGNOSIS — I499 Cardiac arrhythmia, unspecified: Secondary | ICD-10-CM | POA: Diagnosis not present

## 2018-11-30 DIAGNOSIS — 419620001 Death: Secondary | SNOMED CT | POA: Diagnosis not present

## 2018-11-30 DEATH — deceased

## 2018-12-31 DEATH — deceased

## 2019-02-19 ENCOUNTER — Ambulatory Visit: Payer: Medicare Other | Admitting: Neurology
